# Patient Record
Sex: Female | Born: 1945
Health system: Southern US, Community
[De-identification: ages and names within clinical notes are randomized; demographics above are authoritative.]

## PROBLEM LIST (undated history)

## (undated) DIAGNOSIS — I10 Essential (primary) hypertension: Secondary | ICD-10-CM

## (undated) DIAGNOSIS — K219 Gastro-esophageal reflux disease without esophagitis: Secondary | ICD-10-CM

## (undated) DIAGNOSIS — D369 Benign neoplasm, unspecified site: Secondary | ICD-10-CM

## (undated) DIAGNOSIS — J45909 Unspecified asthma, uncomplicated: Secondary | ICD-10-CM

## (undated) DIAGNOSIS — Z972 Presence of dental prosthetic device (complete) (partial): Secondary | ICD-10-CM

## (undated) HISTORY — PX: COLONOSCOPY: SHX174

## (undated) HISTORY — PX: ABDOMINAL HYSTERECTOMY: SHX81

---

## 2002-03-18 HISTORY — PX: BREAST EXCISIONAL BIOPSY: SUR124

## 2004-06-01 ENCOUNTER — Ambulatory Visit: Payer: Self-pay | Admitting: Unknown Physician Specialty

## 2007-02-16 ENCOUNTER — Ambulatory Visit: Payer: Self-pay | Admitting: Internal Medicine

## 2007-02-19 ENCOUNTER — Ambulatory Visit: Payer: Self-pay | Admitting: Internal Medicine

## 2007-06-19 ENCOUNTER — Ambulatory Visit: Payer: Self-pay | Admitting: Internal Medicine

## 2007-07-07 ENCOUNTER — Ambulatory Visit: Payer: Self-pay | Admitting: Unknown Physician Specialty

## 2007-12-22 ENCOUNTER — Ambulatory Visit: Payer: Self-pay | Admitting: Internal Medicine

## 2008-12-29 ENCOUNTER — Ambulatory Visit: Payer: Self-pay | Admitting: Internal Medicine

## 2009-12-25 ENCOUNTER — Ambulatory Visit: Payer: Self-pay | Admitting: Internal Medicine

## 2011-01-23 ENCOUNTER — Ambulatory Visit: Payer: Self-pay | Admitting: Internal Medicine

## 2012-01-28 ENCOUNTER — Ambulatory Visit: Payer: Self-pay | Admitting: Internal Medicine

## 2012-08-20 ENCOUNTER — Ambulatory Visit: Payer: Self-pay | Admitting: Unknown Physician Specialty

## 2013-01-28 ENCOUNTER — Ambulatory Visit: Payer: Self-pay | Admitting: Internal Medicine

## 2013-05-03 ENCOUNTER — Ambulatory Visit: Payer: Self-pay | Admitting: Internal Medicine

## 2014-07-11 ENCOUNTER — Ambulatory Visit
Admit: 2014-07-11 | Disposition: A | Discharge status: Home / Self Care | Payer: Self-pay | Attending: Internal Medicine | Admitting: Internal Medicine

## 2014-07-11 DIAGNOSIS — Z1231 Encounter for screening mammogram for malignant neoplasm of breast: Secondary | ICD-10-CM | POA: Diagnosis not present

## 2014-08-17 DIAGNOSIS — I10 Essential (primary) hypertension: Secondary | ICD-10-CM | POA: Diagnosis not present

## 2014-08-17 DIAGNOSIS — R7309 Other abnormal glucose: Secondary | ICD-10-CM | POA: Diagnosis not present

## 2014-08-17 DIAGNOSIS — E780 Pure hypercholesterolemia: Secondary | ICD-10-CM | POA: Diagnosis not present

## 2014-08-17 DIAGNOSIS — J4520 Mild intermittent asthma, uncomplicated: Secondary | ICD-10-CM | POA: Diagnosis not present

## 2014-08-24 DIAGNOSIS — E780 Pure hypercholesterolemia: Secondary | ICD-10-CM | POA: Diagnosis not present

## 2014-08-24 DIAGNOSIS — I10 Essential (primary) hypertension: Secondary | ICD-10-CM | POA: Diagnosis not present

## 2014-08-24 DIAGNOSIS — J4520 Mild intermittent asthma, uncomplicated: Secondary | ICD-10-CM | POA: Diagnosis not present

## 2014-12-23 DIAGNOSIS — X32XXXA Exposure to sunlight, initial encounter: Secondary | ICD-10-CM | POA: Diagnosis not present

## 2014-12-23 DIAGNOSIS — D2372 Other benign neoplasm of skin of left lower limb, including hip: Secondary | ICD-10-CM | POA: Diagnosis not present

## 2014-12-23 DIAGNOSIS — L918 Other hypertrophic disorders of the skin: Secondary | ICD-10-CM | POA: Diagnosis not present

## 2014-12-23 DIAGNOSIS — L570 Actinic keratosis: Secondary | ICD-10-CM | POA: Diagnosis not present

## 2014-12-23 DIAGNOSIS — D225 Melanocytic nevi of trunk: Secondary | ICD-10-CM | POA: Diagnosis not present

## 2014-12-23 DIAGNOSIS — D2261 Melanocytic nevi of right upper limb, including shoulder: Secondary | ICD-10-CM | POA: Diagnosis not present

## 2015-02-17 DIAGNOSIS — J4520 Mild intermittent asthma, uncomplicated: Secondary | ICD-10-CM | POA: Diagnosis not present

## 2015-02-17 DIAGNOSIS — I10 Essential (primary) hypertension: Secondary | ICD-10-CM | POA: Diagnosis not present

## 2015-02-17 DIAGNOSIS — E7800 Pure hypercholesterolemia, unspecified: Secondary | ICD-10-CM | POA: Diagnosis not present

## 2015-02-24 DIAGNOSIS — I10 Essential (primary) hypertension: Secondary | ICD-10-CM | POA: Diagnosis not present

## 2015-02-24 DIAGNOSIS — E7800 Pure hypercholesterolemia, unspecified: Secondary | ICD-10-CM | POA: Diagnosis not present

## 2015-02-24 DIAGNOSIS — Z0000 Encounter for general adult medical examination without abnormal findings: Secondary | ICD-10-CM | POA: Diagnosis not present

## 2015-02-24 DIAGNOSIS — J4520 Mild intermittent asthma, uncomplicated: Secondary | ICD-10-CM | POA: Diagnosis not present

## 2015-08-18 DIAGNOSIS — E7800 Pure hypercholesterolemia, unspecified: Secondary | ICD-10-CM | POA: Diagnosis not present

## 2015-08-18 DIAGNOSIS — I10 Essential (primary) hypertension: Secondary | ICD-10-CM | POA: Diagnosis not present

## 2015-08-18 DIAGNOSIS — Z0000 Encounter for general adult medical examination without abnormal findings: Secondary | ICD-10-CM | POA: Diagnosis not present

## 2015-08-18 DIAGNOSIS — J4520 Mild intermittent asthma, uncomplicated: Secondary | ICD-10-CM | POA: Diagnosis not present

## 2015-08-25 DIAGNOSIS — J4520 Mild intermittent asthma, uncomplicated: Secondary | ICD-10-CM | POA: Diagnosis not present

## 2015-08-25 DIAGNOSIS — I10 Essential (primary) hypertension: Secondary | ICD-10-CM | POA: Diagnosis not present

## 2015-08-25 DIAGNOSIS — R635 Abnormal weight gain: Secondary | ICD-10-CM | POA: Diagnosis not present

## 2015-08-25 DIAGNOSIS — E7800 Pure hypercholesterolemia, unspecified: Secondary | ICD-10-CM | POA: Diagnosis not present

## 2015-12-12 DIAGNOSIS — Z23 Encounter for immunization: Secondary | ICD-10-CM | POA: Diagnosis not present

## 2015-12-15 DIAGNOSIS — J4520 Mild intermittent asthma, uncomplicated: Secondary | ICD-10-CM | POA: Diagnosis not present

## 2015-12-15 DIAGNOSIS — R635 Abnormal weight gain: Secondary | ICD-10-CM | POA: Diagnosis not present

## 2015-12-15 DIAGNOSIS — E7800 Pure hypercholesterolemia, unspecified: Secondary | ICD-10-CM | POA: Diagnosis not present

## 2015-12-15 DIAGNOSIS — I10 Essential (primary) hypertension: Secondary | ICD-10-CM | POA: Diagnosis not present

## 2015-12-22 ENCOUNTER — Other Ambulatory Visit: Payer: Self-pay | Admitting: Internal Medicine

## 2015-12-22 DIAGNOSIS — L719 Rosacea, unspecified: Secondary | ICD-10-CM | POA: Diagnosis not present

## 2015-12-22 DIAGNOSIS — J302 Other seasonal allergic rhinitis: Secondary | ICD-10-CM | POA: Diagnosis not present

## 2015-12-22 DIAGNOSIS — Z1231 Encounter for screening mammogram for malignant neoplasm of breast: Secondary | ICD-10-CM

## 2015-12-22 DIAGNOSIS — I10 Essential (primary) hypertension: Secondary | ICD-10-CM | POA: Diagnosis not present

## 2015-12-22 DIAGNOSIS — E7800 Pure hypercholesterolemia, unspecified: Secondary | ICD-10-CM | POA: Diagnosis not present

## 2015-12-22 DIAGNOSIS — Z0000 Encounter for general adult medical examination without abnormal findings: Secondary | ICD-10-CM | POA: Diagnosis not present

## 2015-12-22 DIAGNOSIS — R635 Abnormal weight gain: Secondary | ICD-10-CM | POA: Diagnosis not present

## 2016-01-02 DIAGNOSIS — D225 Melanocytic nevi of trunk: Secondary | ICD-10-CM | POA: Diagnosis not present

## 2016-01-02 DIAGNOSIS — D2272 Melanocytic nevi of left lower limb, including hip: Secondary | ICD-10-CM | POA: Diagnosis not present

## 2016-01-02 DIAGNOSIS — D2261 Melanocytic nevi of right upper limb, including shoulder: Secondary | ICD-10-CM | POA: Diagnosis not present

## 2016-01-02 DIAGNOSIS — L718 Other rosacea: Secondary | ICD-10-CM | POA: Diagnosis not present

## 2016-01-23 ENCOUNTER — Ambulatory Visit
Admission: RE | Admit: 2016-01-23 | Discharge: 2016-01-23 | Disposition: A | Discharge status: Home / Self Care | Payer: Commercial Managed Care - HMO | Source: Ambulatory Visit | Attending: Internal Medicine | Admitting: Internal Medicine

## 2016-01-23 DIAGNOSIS — Z1231 Encounter for screening mammogram for malignant neoplasm of breast: Secondary | ICD-10-CM | POA: Insufficient documentation

## 2016-04-29 DIAGNOSIS — H43811 Vitreous degeneration, right eye: Secondary | ICD-10-CM | POA: Diagnosis not present

## 2016-06-12 DIAGNOSIS — Z1231 Encounter for screening mammogram for malignant neoplasm of breast: Secondary | ICD-10-CM | POA: Diagnosis not present

## 2016-06-12 DIAGNOSIS — J302 Other seasonal allergic rhinitis: Secondary | ICD-10-CM | POA: Diagnosis not present

## 2016-06-12 DIAGNOSIS — I10 Essential (primary) hypertension: Secondary | ICD-10-CM | POA: Diagnosis not present

## 2016-06-12 DIAGNOSIS — E7800 Pure hypercholesterolemia, unspecified: Secondary | ICD-10-CM | POA: Diagnosis not present

## 2016-06-12 DIAGNOSIS — R635 Abnormal weight gain: Secondary | ICD-10-CM | POA: Diagnosis not present

## 2016-06-19 DIAGNOSIS — J4520 Mild intermittent asthma, uncomplicated: Secondary | ICD-10-CM | POA: Diagnosis not present

## 2016-06-19 DIAGNOSIS — E7800 Pure hypercholesterolemia, unspecified: Secondary | ICD-10-CM | POA: Diagnosis not present

## 2016-06-19 DIAGNOSIS — I10 Essential (primary) hypertension: Secondary | ICD-10-CM | POA: Diagnosis not present

## 2016-06-19 DIAGNOSIS — R635 Abnormal weight gain: Secondary | ICD-10-CM | POA: Diagnosis not present

## 2016-06-19 DIAGNOSIS — Z0000 Encounter for general adult medical examination without abnormal findings: Secondary | ICD-10-CM | POA: Diagnosis not present

## 2016-06-28 DIAGNOSIS — H43811 Vitreous degeneration, right eye: Secondary | ICD-10-CM | POA: Diagnosis not present

## 2016-12-10 DIAGNOSIS — Z23 Encounter for immunization: Secondary | ICD-10-CM | POA: Diagnosis not present

## 2016-12-13 DIAGNOSIS — Z0000 Encounter for general adult medical examination without abnormal findings: Secondary | ICD-10-CM | POA: Diagnosis not present

## 2016-12-13 DIAGNOSIS — J4520 Mild intermittent asthma, uncomplicated: Secondary | ICD-10-CM | POA: Diagnosis not present

## 2016-12-13 DIAGNOSIS — E7800 Pure hypercholesterolemia, unspecified: Secondary | ICD-10-CM | POA: Diagnosis not present

## 2016-12-13 DIAGNOSIS — I10 Essential (primary) hypertension: Secondary | ICD-10-CM | POA: Diagnosis not present

## 2016-12-20 ENCOUNTER — Other Ambulatory Visit: Payer: Self-pay | Admitting: Internal Medicine

## 2016-12-20 DIAGNOSIS — E7800 Pure hypercholesterolemia, unspecified: Secondary | ICD-10-CM | POA: Diagnosis not present

## 2016-12-20 DIAGNOSIS — M79671 Pain in right foot: Secondary | ICD-10-CM | POA: Diagnosis not present

## 2016-12-20 DIAGNOSIS — J4520 Mild intermittent asthma, uncomplicated: Secondary | ICD-10-CM | POA: Diagnosis not present

## 2016-12-20 DIAGNOSIS — R7989 Other specified abnormal findings of blood chemistry: Secondary | ICD-10-CM | POA: Diagnosis not present

## 2016-12-20 DIAGNOSIS — M19071 Primary osteoarthritis, right ankle and foot: Secondary | ICD-10-CM | POA: Diagnosis not present

## 2016-12-20 DIAGNOSIS — Z1231 Encounter for screening mammogram for malignant neoplasm of breast: Secondary | ICD-10-CM | POA: Diagnosis not present

## 2016-12-20 DIAGNOSIS — I10 Essential (primary) hypertension: Secondary | ICD-10-CM | POA: Diagnosis not present

## 2017-01-24 ENCOUNTER — Ambulatory Visit
Admission: RE | Admit: 2017-01-24 | Discharge: 2017-01-24 | Disposition: A | Discharge status: Home / Self Care | Payer: Commercial Managed Care - HMO | Source: Ambulatory Visit | Attending: Internal Medicine | Admitting: Internal Medicine

## 2017-01-24 DIAGNOSIS — Z1231 Encounter for screening mammogram for malignant neoplasm of breast: Secondary | ICD-10-CM | POA: Diagnosis not present

## 2017-03-14 DIAGNOSIS — R399 Unspecified symptoms and signs involving the genitourinary system: Secondary | ICD-10-CM | POA: Diagnosis not present

## 2017-03-14 DIAGNOSIS — R8290 Unspecified abnormal findings in urine: Secondary | ICD-10-CM | POA: Diagnosis not present

## 2017-06-13 DIAGNOSIS — Z1231 Encounter for screening mammogram for malignant neoplasm of breast: Secondary | ICD-10-CM | POA: Diagnosis not present

## 2017-06-13 DIAGNOSIS — E7800 Pure hypercholesterolemia, unspecified: Secondary | ICD-10-CM | POA: Diagnosis not present

## 2017-06-13 DIAGNOSIS — J4520 Mild intermittent asthma, uncomplicated: Secondary | ICD-10-CM | POA: Diagnosis not present

## 2017-06-13 DIAGNOSIS — R7989 Other specified abnormal findings of blood chemistry: Secondary | ICD-10-CM | POA: Diagnosis not present

## 2017-06-13 DIAGNOSIS — I10 Essential (primary) hypertension: Secondary | ICD-10-CM | POA: Diagnosis not present

## 2017-06-20 DIAGNOSIS — E7800 Pure hypercholesterolemia, unspecified: Secondary | ICD-10-CM | POA: Diagnosis not present

## 2017-06-20 DIAGNOSIS — I10 Essential (primary) hypertension: Secondary | ICD-10-CM | POA: Diagnosis not present

## 2017-06-20 DIAGNOSIS — J4520 Mild intermittent asthma, uncomplicated: Secondary | ICD-10-CM | POA: Diagnosis not present

## 2017-06-20 DIAGNOSIS — Z0000 Encounter for general adult medical examination without abnormal findings: Secondary | ICD-10-CM | POA: Diagnosis not present

## 2017-09-23 DIAGNOSIS — B373 Candidiasis of vulva and vagina: Secondary | ICD-10-CM | POA: Diagnosis not present

## 2017-09-23 DIAGNOSIS — Z1331 Encounter for screening for depression: Secondary | ICD-10-CM | POA: Diagnosis not present

## 2017-09-23 DIAGNOSIS — Z1211 Encounter for screening for malignant neoplasm of colon: Secondary | ICD-10-CM | POA: Diagnosis not present

## 2017-09-23 DIAGNOSIS — Z01419 Encounter for gynecological examination (general) (routine) without abnormal findings: Secondary | ICD-10-CM | POA: Diagnosis not present

## 2017-11-06 DIAGNOSIS — D225 Melanocytic nevi of trunk: Secondary | ICD-10-CM | POA: Diagnosis not present

## 2017-11-06 DIAGNOSIS — D2271 Melanocytic nevi of right lower limb, including hip: Secondary | ICD-10-CM | POA: Diagnosis not present

## 2017-11-06 DIAGNOSIS — D2261 Melanocytic nevi of right upper limb, including shoulder: Secondary | ICD-10-CM | POA: Diagnosis not present

## 2017-11-06 DIAGNOSIS — L218 Other seborrheic dermatitis: Secondary | ICD-10-CM | POA: Diagnosis not present

## 2017-11-06 DIAGNOSIS — D2262 Melanocytic nevi of left upper limb, including shoulder: Secondary | ICD-10-CM | POA: Diagnosis not present

## 2017-11-06 DIAGNOSIS — L821 Other seborrheic keratosis: Secondary | ICD-10-CM | POA: Diagnosis not present

## 2017-11-06 DIAGNOSIS — D2272 Melanocytic nevi of left lower limb, including hip: Secondary | ICD-10-CM | POA: Diagnosis not present

## 2017-12-01 DIAGNOSIS — H2513 Age-related nuclear cataract, bilateral: Secondary | ICD-10-CM | POA: Diagnosis not present

## 2017-12-16 DIAGNOSIS — I10 Essential (primary) hypertension: Secondary | ICD-10-CM | POA: Diagnosis not present

## 2017-12-16 DIAGNOSIS — J4520 Mild intermittent asthma, uncomplicated: Secondary | ICD-10-CM | POA: Diagnosis not present

## 2017-12-16 DIAGNOSIS — E7800 Pure hypercholesterolemia, unspecified: Secondary | ICD-10-CM | POA: Diagnosis not present

## 2017-12-23 ENCOUNTER — Other Ambulatory Visit: Payer: Self-pay | Admitting: Internal Medicine

## 2017-12-23 DIAGNOSIS — J4520 Mild intermittent asthma, uncomplicated: Secondary | ICD-10-CM | POA: Diagnosis not present

## 2017-12-23 DIAGNOSIS — Z23 Encounter for immunization: Secondary | ICD-10-CM | POA: Diagnosis not present

## 2017-12-23 DIAGNOSIS — R635 Abnormal weight gain: Secondary | ICD-10-CM | POA: Diagnosis not present

## 2017-12-23 DIAGNOSIS — I10 Essential (primary) hypertension: Secondary | ICD-10-CM | POA: Diagnosis not present

## 2017-12-23 DIAGNOSIS — Z1239 Encounter for other screening for malignant neoplasm of breast: Secondary | ICD-10-CM | POA: Diagnosis not present

## 2017-12-23 DIAGNOSIS — Z0000 Encounter for general adult medical examination without abnormal findings: Secondary | ICD-10-CM | POA: Diagnosis not present

## 2017-12-23 DIAGNOSIS — Z1231 Encounter for screening mammogram for malignant neoplasm of breast: Secondary | ICD-10-CM

## 2017-12-23 DIAGNOSIS — E7800 Pure hypercholesterolemia, unspecified: Secondary | ICD-10-CM | POA: Diagnosis not present

## 2017-12-24 DIAGNOSIS — Z86010 Personal history of colonic polyps: Secondary | ICD-10-CM | POA: Diagnosis not present

## 2018-01-13 ENCOUNTER — Encounter: Payer: Self-pay | Admitting: *Deleted

## 2018-01-14 ENCOUNTER — Ambulatory Visit: Payer: Medicare HMO | Admitting: Anesthesiology

## 2018-01-14 ENCOUNTER — Encounter
Admission: RE | Disposition: A | Discharge status: Home / Self Care | Payer: Self-pay | Source: Ambulatory Visit | Attending: Unknown Physician Specialty

## 2018-01-14 ENCOUNTER — Encounter: Payer: Self-pay | Admitting: Anesthesiology

## 2018-01-14 ENCOUNTER — Ambulatory Visit
Admission: RE | Admit: 2018-01-14 | Discharge: 2018-01-14 | Disposition: A | Discharge status: Home / Self Care | Payer: Medicare HMO | Source: Ambulatory Visit | Attending: Unknown Physician Specialty | Admitting: Unknown Physician Specialty

## 2018-01-14 DIAGNOSIS — Z86010 Personal history of colonic polyps: Secondary | ICD-10-CM | POA: Diagnosis not present

## 2018-01-14 DIAGNOSIS — Z6834 Body mass index (BMI) 34.0-34.9, adult: Secondary | ICD-10-CM | POA: Diagnosis not present

## 2018-01-14 DIAGNOSIS — Z1211 Encounter for screening for malignant neoplasm of colon: Secondary | ICD-10-CM | POA: Insufficient documentation

## 2018-01-14 DIAGNOSIS — J45909 Unspecified asthma, uncomplicated: Secondary | ICD-10-CM | POA: Insufficient documentation

## 2018-01-14 DIAGNOSIS — K635 Polyp of colon: Secondary | ICD-10-CM | POA: Diagnosis not present

## 2018-01-14 DIAGNOSIS — K648 Other hemorrhoids: Secondary | ICD-10-CM | POA: Diagnosis not present

## 2018-01-14 DIAGNOSIS — K219 Gastro-esophageal reflux disease without esophagitis: Secondary | ICD-10-CM | POA: Diagnosis not present

## 2018-01-14 DIAGNOSIS — K640 First degree hemorrhoids: Secondary | ICD-10-CM | POA: Diagnosis not present

## 2018-01-14 DIAGNOSIS — Z7982 Long term (current) use of aspirin: Secondary | ICD-10-CM | POA: Diagnosis not present

## 2018-01-14 DIAGNOSIS — Z79899 Other long term (current) drug therapy: Secondary | ICD-10-CM | POA: Insufficient documentation

## 2018-01-14 DIAGNOSIS — K5790 Diverticulosis of intestine, part unspecified, without perforation or abscess without bleeding: Secondary | ICD-10-CM | POA: Diagnosis not present

## 2018-01-14 DIAGNOSIS — E669 Obesity, unspecified: Secondary | ICD-10-CM | POA: Insufficient documentation

## 2018-01-14 DIAGNOSIS — I10 Essential (primary) hypertension: Secondary | ICD-10-CM | POA: Diagnosis not present

## 2018-01-14 DIAGNOSIS — K5730 Diverticulosis of large intestine without perforation or abscess without bleeding: Secondary | ICD-10-CM | POA: Insufficient documentation

## 2018-01-14 DIAGNOSIS — D122 Benign neoplasm of ascending colon: Secondary | ICD-10-CM | POA: Diagnosis not present

## 2018-01-14 HISTORY — DX: Unspecified asthma, uncomplicated: J45.909

## 2018-01-14 HISTORY — PX: COLONOSCOPY WITH PROPOFOL: SHX5780

## 2018-01-14 HISTORY — DX: Essential (primary) hypertension: I10

## 2018-01-14 HISTORY — DX: Benign neoplasm, unspecified site: D36.9

## 2018-01-14 HISTORY — DX: Gastro-esophageal reflux disease without esophagitis: K21.9

## 2018-01-14 SURGERY — COLONOSCOPY WITH PROPOFOL
Anesthesia: General

## 2018-01-14 MED ORDER — PROPOFOL 500 MG/50ML IV EMUL
INTRAVENOUS | Status: DC | PRN
  Administered 2018-01-14: 80 ug/kg/min via INTRAVENOUS

## 2018-01-14 MED ORDER — SODIUM CHLORIDE 0.9 % IV SOLN: INTRAVENOUS | Status: DC

## 2018-01-14 MED ORDER — PROPOFOL 10 MG/ML IV BOLUS
INTRAVENOUS | Status: DC | PRN
  Administered 2018-01-14: 50 mg via INTRAVENOUS
  Administered 2018-01-14: 20 mg via INTRAVENOUS
  Administered 2018-01-14: 30 mg via INTRAVENOUS

## 2018-01-14 MED ORDER — SODIUM CHLORIDE 0.9 % IV SOLN
INTRAVENOUS | Status: DC
  Administered 2018-01-14: 11:00:00 via INTRAVENOUS

## 2018-01-14 MED ORDER — PROPOFOL 500 MG/50ML IV EMUL
INTRAVENOUS | Status: AC
  Filled 2018-01-14: qty 50

## 2018-01-14 NOTE — Op Note (Signed)
[facility] Gastroenterology Patient Name: Breanna Gilbert Procedure Date: 01/14/2018 12:09 PM MRN: [id number] Account #: [id number] Date of Birth: 1945/05/20 Admit Type: Outpatient Age: 72 Room: [facility] ENDO ROOM 2 Gender: Female Note Status: Finalized Procedure:            Colonoscopy Indications:          High risk colon cancer surveillance: Personal history                        of colonic polyps Providers:            Manya Silvas, MD Referring MD:         Tracie Harrier, MD (Referring MD) Medicines:            Propofol per Anesthesia Complications:        No immediate complications. Procedure:            Pre-Anesthesia Assessment:                       - After reviewing the risks and benefits, the patient                        was deemed in satisfactory condition to undergo the                        procedure.                       After obtaining informed consent, the colonoscope was                        passed under direct vision. Throughout the procedure,                        the patient's blood pressure, pulse, and oxygen                        saturations were monitored continuously. The                        Colonoscope was introduced through the anus and                        advanced to the the cecum, identified by appendiceal                        orifice and ileocecal valve. Picture of cecum not done                        by accident.The colonoscopy was performed without                        difficulty. The patient tolerated the procedure well.                        The quality of the bowel preparation was excellent. Findings:      A diminutive polyp was found in the ascending colon. The polyp was       sessile. The polyp was removed with a jumbo cold forceps. Resection and       retrieval were complete.  Multiple small and large-mouthed diverticula were found in the sigmoid       colon and descending colon.      Internal  hemorrhoids were found during endoscopy. The hemorrhoids were       small and Grade I (internal hemorrhoids that do not prolapse).      The exam was otherwise without abnormality. Impression:           - One diminutive polyp in the ascending colon, removed                        with a jumbo cold forceps. Resected and retrieved.                       - Diverticulosis in the sigmoid colon and in the                        descending colon.                       - Internal hemorrhoids.                       - The examination was otherwise normal. Recommendation:       - Await pathology results. Manya Silvas, MD 01/14/2018 12:37:44 PM This report has been signed electronically. Number of Addenda: 0 Note Initiated On: 01/14/2018 12:09 PM Scope Withdrawal Time: 0 hours 11 minutes 49 seconds  Total Procedure Duration: 0 hours 19 minutes 33 seconds       [facility]

## 2018-01-14 NOTE — Anesthesia Postprocedure Evaluation (Signed)
Anesthesia Post Note  Patient: Breanna Gilbert  Procedure(s) Performed: COLONOSCOPY WITH PROPOFOL (N/A )  Patient location during evaluation: Endoscopy Anesthesia Type: General Level of consciousness: awake and alert Pain management: pain level controlled Vital Signs Assessment: post-procedure vital signs reviewed and stable Respiratory status: spontaneous breathing, nonlabored ventilation, respiratory function stable and patient connected to nasal cannula oxygen Cardiovascular status: blood pressure returned to baseline and stable Postop Assessment: no apparent nausea or vomiting Anesthetic complications: no     Last Vitals:  Vitals:   01/14/18 1246 01/14/18 1256  BP: (!) 130/115 136/88  Pulse: 71 78  Resp: (!) 26 (!) 22  Temp:    SpO2: 100% 100%    Last Pain:  Vitals:   01/14/18 1256  TempSrc:   PainSc: 0-No pain                 [name] S

## 2018-01-14 NOTE — Anesthesia Post-op Follow-up Note (Signed)
Anesthesia QCDR form completed.        

## 2018-01-14 NOTE — Transfer of Care (Signed)
Immediate Anesthesia Transfer of Care Note  Patient: Breanna Gilbert  Procedure(s) Performed: COLONOSCOPY WITH PROPOFOL (N/A )  Patient Location: PACU  Anesthesia Type:General  Level of Consciousness: awake  Airway & Oxygen Therapy: Patient Spontanous Breathing  Post-op Assessment: Report given to RN  Post vital signs: stable  Last Vitals:  Vitals Value Taken Time  BP    Temp    Pulse 75 01/14/2018 12:36 PM  Resp    SpO2 99 % 01/14/2018 12:36 PM  Vitals shown include unvalidated device data.  Last Pain:  Vitals:   01/14/18 1033  TempSrc: Tympanic         Complications: No apparent anesthesia complications

## 2018-01-14 NOTE — H&P (Signed)
Primary Care Physician:  Tracie Harrier, MD Primary Gastroenterologist:  Dr. Vira Agar  Pre-Procedure History & Physical: HPI:  Breanna Gilbert is a 72 y.o. female is here for an colonoscopy.   Past Medical History:  Diagnosis Date  . Adenomatous polyps   . Asthma   . GERD (gastroesophageal reflux disease)   . Hypertension     Past Surgical History:  Procedure Laterality Date  . ABDOMINAL HYSTERECTOMY    . BREAST EXCISIONAL BIOPSY Right 2004   NEG  . COLONOSCOPY      Prior to Admission medications   Medication Sig Start Date End Date Taking? Authorizing Provider  albuterol (PROVENTIL HFA;VENTOLIN HFA) 108 (90 Base) MCG/ACT inhaler Inhale into the lungs every 6 (six) hours as needed for wheezing or shortness of breath.   Yes [provider]  Ascorbic Acid (VITAMIN C) 100 MG tablet Take 100 mg by mouth daily.   Yes [provider]  aspirin 325 MG tablet Take 325 mg by mouth daily.   Yes [provider]  cetirizine (ZYRTEC) 10 MG tablet Take 10 mg by mouth daily.   Yes [provider]  Folic Acid-Vit B7-JIR C78 (FOLBEE) 2.5-25-1 MG TABS tablet Take 1 tablet by mouth daily.   Yes [provider]  losartan (COZAAR) 50 MG tablet Take 50 mg by mouth daily.   Yes [provider]  phentermine (ADIPEX-P) 37.5 MG tablet Take 37.5 mg by mouth daily before breakfast.   Yes [provider]  simvastatin (ZOCOR) 40 MG tablet Take 40 mg by mouth daily.   Yes [provider]  budesonide-formoterol (SYMBICORT) 160-4.5 MCG/ACT inhaler Inhale 2 puffs into the lungs 2 (two) times daily.    [provider]  vitamin E 100 UNIT capsule Take 400 Units by mouth daily.    [provider]    Allergies as of 01/06/2018  . (Not on File)    Family History  Problem Relation Age of Onset  . Breast cancer Maternal Aunt     Social History   Socioeconomic History  . Marital status: Divorced    Spouse name: Not on  file  . Number of children: Not on file  . Years of education: Not on file  . Highest education level: Not on file  Occupational History  . Not on file  Social Needs  . Financial resource strain: Not on file  . Food insecurity:    Worry: Not on file    Inability: Not on file  . Transportation needs:    Medical: Not on file    Non-medical: Not on file  Tobacco Use  . Smoking status: Never Smoker  . Smokeless tobacco: Never Used  Substance and Sexual Activity  . Alcohol use: Never    Frequency: Never  . Drug use: Never  . Sexual activity: Not on file  Lifestyle  . Physical activity:    Days per week: Not on file    Minutes per session: Not on file  . Stress: Not on file  Relationships  . Social connections:    Talks on phone: Not on file    Gets together: Not on file    Attends religious service: Not on file    Active member of club or organization: Not on file    Attends meetings of clubs or organizations: Not on file    Relationship status: Not on file  . Intimate partner violence:    Fear of current or ex partner: Not on file  Emotionally abused: Not on file    Physically abused: Not on file    Forced sexual activity: Not on file  Other Topics Concern  . Not on file  Social History Narrative  . Not on file    Review of Systems: See HPI, otherwise negative ROS  Physical Exam: BP (!) 147/68   Pulse 89   Temp 98.7 F (37.1 C) (Tympanic)   Resp 16   Ht 5\' 2"  (1.575 m)   Wt 86.2 kg   SpO2 98%   BMI 34.75 kg/m  General:   Alert,  pleasant and cooperative in NAD Head:  Normocephalic and atraumatic. Neck:  Supple; no masses or thyromegaly. Lungs:  Clear throughout to auscultation.    Heart:  Regular rate and rhythm. Abdomen:  Soft, nontender and nondistended. Normal bowel sounds, without guarding, and without rebound.   Neurologic:  Alert and  oriented x4;  grossly normal neurologically.  Impression/Plan: Breanna Gilbert is here for an colonoscopy to be  performed for [facility] colon polyps.  Risks, benefits, limitations, and alternatives regarding  colonoscopy have been reviewed with the patient.  Questions have been answered.  All parties agreeable.   Gaylyn Cheers, MD  01/14/2018, 12:05 PM

## 2018-01-14 NOTE — Anesthesia Preprocedure Evaluation (Addendum)
Anesthesia Evaluation  Patient identified by MRN, date of birth, ID band Patient awake    Reviewed: Allergy & Precautions, NPO status , Patient's Chart, lab work & pertinent test results, reviewed documented beta blocker date and time   Airway Mallampati: III  TM Distance: >3 FB     Dental  (+) Chipped, Upper Dentures   Pulmonary asthma ,           Cardiovascular hypertension, Pt. on medications      Neuro/Psych    GI/Hepatic   Endo/Other    Renal/GU      Musculoskeletal   Abdominal   Peds  Hematology   Anesthesia Other Findings Obese.  Reproductive/Obstetrics                            Anesthesia Physical Anesthesia Plan  ASA: III  Anesthesia Plan: General   Post-op Pain Management:    Induction: Intravenous  PONV Risk Score and Plan:   Airway Management Planned:   Additional Equipment:   Intra-op Plan:   Post-operative Plan:   Informed Consent: I have reviewed the patients History and Physical, chart, labs and discussed the procedure including the risks, benefits and alternatives for the proposed anesthesia with the patient or authorized representative who has indicated his/her understanding and acceptance.     Plan Discussed with: CRNA  Anesthesia Plan Comments:         Anesthesia Quick Evaluation

## 2018-01-15 ENCOUNTER — Encounter: Payer: Self-pay | Admitting: Unknown Physician Specialty

## 2018-01-16 DIAGNOSIS — I10 Essential (primary) hypertension: Secondary | ICD-10-CM | POA: Diagnosis not present

## 2018-01-16 DIAGNOSIS — E7800 Pure hypercholesterolemia, unspecified: Secondary | ICD-10-CM | POA: Diagnosis not present

## 2018-01-16 DIAGNOSIS — R590 Localized enlarged lymph nodes: Secondary | ICD-10-CM | POA: Diagnosis not present

## 2018-01-16 DIAGNOSIS — J4520 Mild intermittent asthma, uncomplicated: Secondary | ICD-10-CM | POA: Diagnosis not present

## 2018-01-16 DIAGNOSIS — Z23 Encounter for immunization: Secondary | ICD-10-CM | POA: Diagnosis not present

## 2018-01-17 LAB — SURGICAL PATHOLOGY

## 2018-02-11 ENCOUNTER — Ambulatory Visit
Admission: RE | Admit: 2018-02-11 | Discharge: 2018-02-11 | Disposition: A | Discharge status: Home / Self Care | Payer: Medicare HMO | Source: Ambulatory Visit | Attending: Internal Medicine | Admitting: Internal Medicine

## 2018-02-11 DIAGNOSIS — Z1231 Encounter for screening mammogram for malignant neoplasm of breast: Secondary | ICD-10-CM | POA: Diagnosis not present

## 2018-10-30 ENCOUNTER — Other Ambulatory Visit: Payer: Self-pay | Admitting: Internal Medicine

## 2018-10-30 DIAGNOSIS — R590 Localized enlarged lymph nodes: Secondary | ICD-10-CM

## 2018-10-30 DIAGNOSIS — I10 Essential (primary) hypertension: Secondary | ICD-10-CM

## 2018-11-05 ENCOUNTER — Ambulatory Visit: Payer: Medicare HMO

## 2018-11-11 ENCOUNTER — Other Ambulatory Visit: Payer: Self-pay | Admitting: Internal Medicine

## 2018-11-11 DIAGNOSIS — I10 Essential (primary) hypertension: Secondary | ICD-10-CM

## 2018-11-11 DIAGNOSIS — R590 Localized enlarged lymph nodes: Secondary | ICD-10-CM

## 2018-11-16 ENCOUNTER — Encounter (HOSPITAL_COMMUNITY): Payer: Self-pay

## 2018-11-16 ENCOUNTER — Ambulatory Visit (HOSPITAL_COMMUNITY): Payer: Medicare HMO

## 2018-11-17 ENCOUNTER — Ambulatory Visit: Payer: Medicare HMO

## 2018-11-27 ENCOUNTER — Other Ambulatory Visit: Payer: Self-pay | Admitting: Internal Medicine

## 2018-11-27 DIAGNOSIS — R590 Localized enlarged lymph nodes: Secondary | ICD-10-CM

## 2018-12-03 ENCOUNTER — Ambulatory Visit
Admission: RE | Admit: 2018-12-03 | Discharge: 2018-12-03 | Disposition: A | Discharge status: Home / Self Care | Payer: Medicare HMO | Source: Ambulatory Visit | Attending: Internal Medicine | Admitting: Internal Medicine

## 2018-12-03 ENCOUNTER — Other Ambulatory Visit: Payer: Self-pay

## 2018-12-03 DIAGNOSIS — R590 Localized enlarged lymph nodes: Secondary | ICD-10-CM | POA: Insufficient documentation

## 2018-12-07 ENCOUNTER — Other Ambulatory Visit: Payer: Self-pay | Admitting: Internal Medicine

## 2018-12-07 DIAGNOSIS — R591 Generalized enlarged lymph nodes: Secondary | ICD-10-CM

## 2018-12-09 ENCOUNTER — Ambulatory Visit: Admission: RE | Admit: 2018-12-09 | Payer: Medicare HMO | Source: Ambulatory Visit

## 2018-12-18 ENCOUNTER — Other Ambulatory Visit: Payer: Self-pay | Admitting: Internal Medicine

## 2018-12-18 DIAGNOSIS — R053 Chronic cough: Secondary | ICD-10-CM

## 2018-12-18 DIAGNOSIS — R590 Localized enlarged lymph nodes: Secondary | ICD-10-CM

## 2018-12-18 DIAGNOSIS — R05 Cough: Secondary | ICD-10-CM

## 2018-12-18 DIAGNOSIS — R591 Generalized enlarged lymph nodes: Secondary | ICD-10-CM

## 2018-12-31 ENCOUNTER — Other Ambulatory Visit: Payer: Self-pay

## 2018-12-31 ENCOUNTER — Ambulatory Visit
Admission: RE | Admit: 2018-12-31 | Discharge: 2018-12-31 | Disposition: A | Discharge status: Home / Self Care | Payer: Medicare HMO | Source: Ambulatory Visit | Attending: Internal Medicine | Admitting: Internal Medicine

## 2018-12-31 DIAGNOSIS — R05 Cough: Secondary | ICD-10-CM | POA: Diagnosis present

## 2018-12-31 DIAGNOSIS — R591 Generalized enlarged lymph nodes: Secondary | ICD-10-CM | POA: Diagnosis not present

## 2018-12-31 DIAGNOSIS — R053 Chronic cough: Secondary | ICD-10-CM

## 2018-12-31 DIAGNOSIS — R590 Localized enlarged lymph nodes: Secondary | ICD-10-CM | POA: Diagnosis present

## 2018-12-31 LAB — POCT I-STAT CREATININE: Creatinine, Ser: 0.7 mg/dL (ref 0.44–1.00)

## 2018-12-31 MED ORDER — IOHEXOL 300 MG/ML  SOLN
75.0000 mL | Freq: Once | INTRAMUSCULAR | Status: AC | PRN
  Administered 2018-12-31: 10:00:00 75 mL via INTRAVENOUS

## 2019-01-06 ENCOUNTER — Other Ambulatory Visit: Payer: Self-pay | Admitting: Internal Medicine

## 2019-01-06 DIAGNOSIS — K118 Other diseases of salivary glands: Secondary | ICD-10-CM

## 2019-01-06 DIAGNOSIS — R918 Other nonspecific abnormal finding of lung field: Secondary | ICD-10-CM

## 2019-02-03 ENCOUNTER — Other Ambulatory Visit: Payer: Self-pay | Admitting: Internal Medicine

## 2019-02-03 DIAGNOSIS — Z1231 Encounter for screening mammogram for malignant neoplasm of breast: Secondary | ICD-10-CM

## 2019-02-16 ENCOUNTER — Ambulatory Visit
Admission: RE | Admit: 2019-02-16 | Discharge: 2019-02-16 | Disposition: A | Discharge status: Home / Self Care | Payer: Medicare HMO | Source: Ambulatory Visit | Attending: Internal Medicine | Admitting: Internal Medicine

## 2019-02-16 DIAGNOSIS — Z1231 Encounter for screening mammogram for malignant neoplasm of breast: Secondary | ICD-10-CM | POA: Diagnosis not present

## 2019-04-07 ENCOUNTER — Ambulatory Visit: Payer: Medicare HMO

## 2019-06-22 ENCOUNTER — Ambulatory Visit
Admission: RE | Admit: 2019-06-22 | Discharge: 2019-06-22 | Disposition: A | Discharge status: Home / Self Care | Payer: Medicare HMO | Source: Ambulatory Visit | Attending: Internal Medicine | Admitting: Internal Medicine

## 2019-06-22 ENCOUNTER — Other Ambulatory Visit: Payer: Self-pay

## 2019-06-22 DIAGNOSIS — K118 Other diseases of salivary glands: Secondary | ICD-10-CM | POA: Diagnosis not present

## 2019-06-22 DIAGNOSIS — R918 Other nonspecific abnormal finding of lung field: Secondary | ICD-10-CM | POA: Diagnosis present

## 2019-09-03 ENCOUNTER — Other Ambulatory Visit: Payer: Self-pay | Admitting: Internal Medicine

## 2019-09-03 DIAGNOSIS — R053 Chronic cough: Secondary | ICD-10-CM

## 2019-09-03 DIAGNOSIS — J4540 Moderate persistent asthma, uncomplicated: Secondary | ICD-10-CM

## 2019-12-30 IMAGING — MG DIGITAL SCREENING BILAT W/ TOMO W/ CAD
6 of 10 series · 6 of 30 positions shown · non-contrast
Comparison: Previous exam(s).

CLINICAL DATA: Screening.

EXAM:
DIGITAL SCREENING BILATERAL MAMMOGRAM WITH TOMO AND CAD

[R MLO synth-2D]
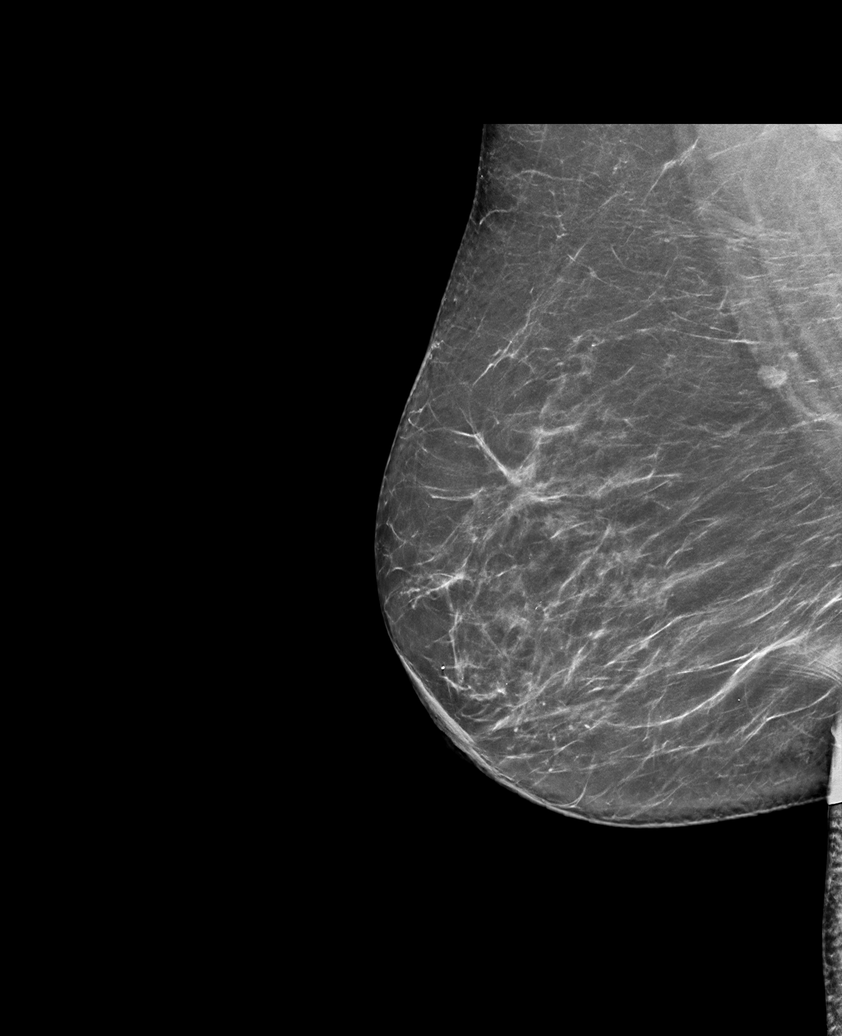

[L MLO synth-2D (1 of 2)]
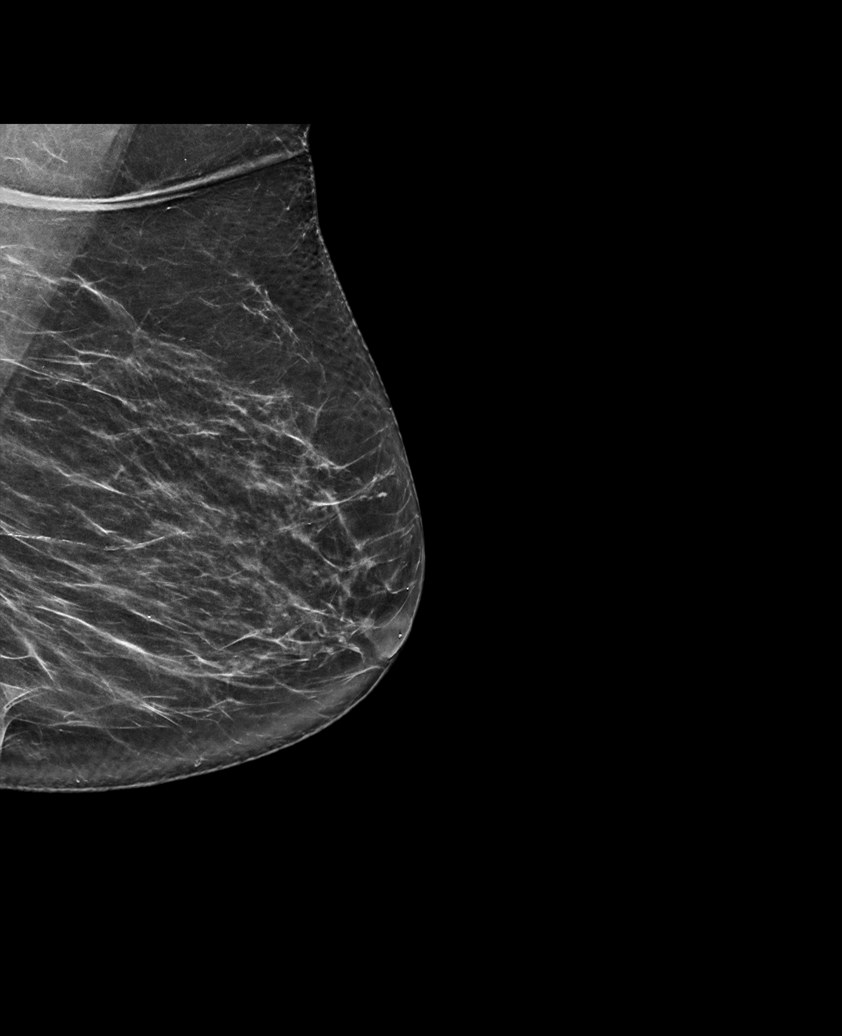

[L CC synth-2D]
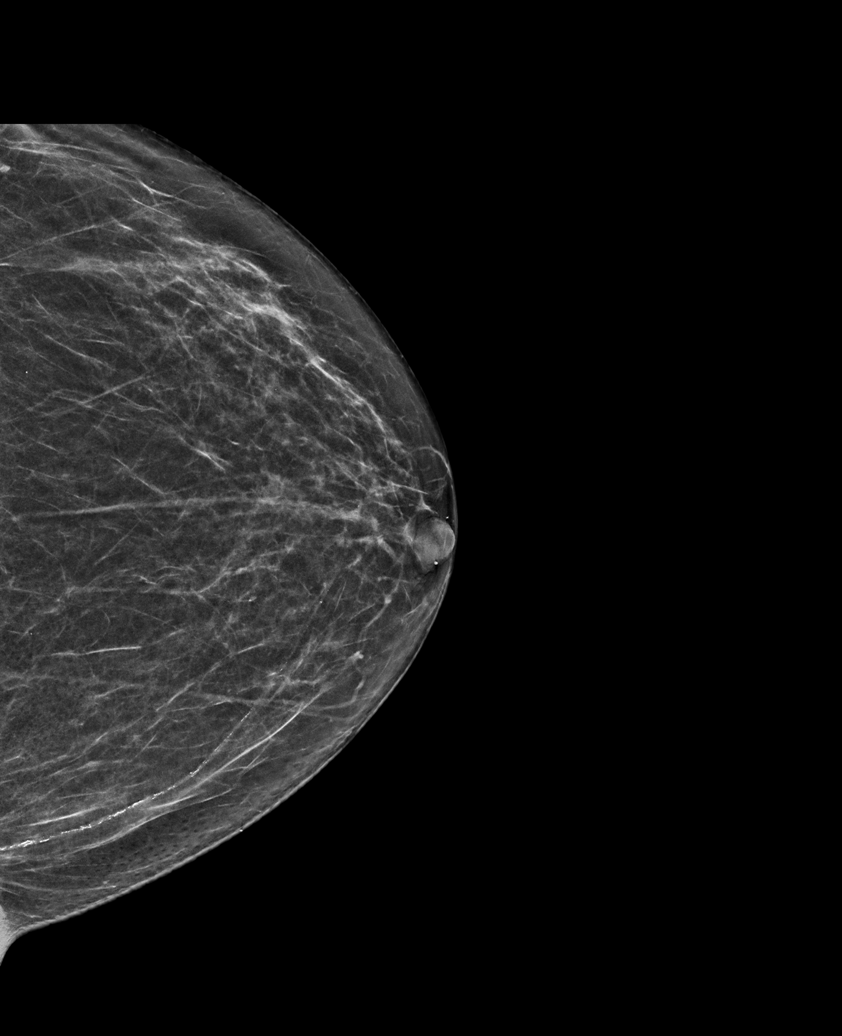

[L MLO synth-2D (2 of 2)]
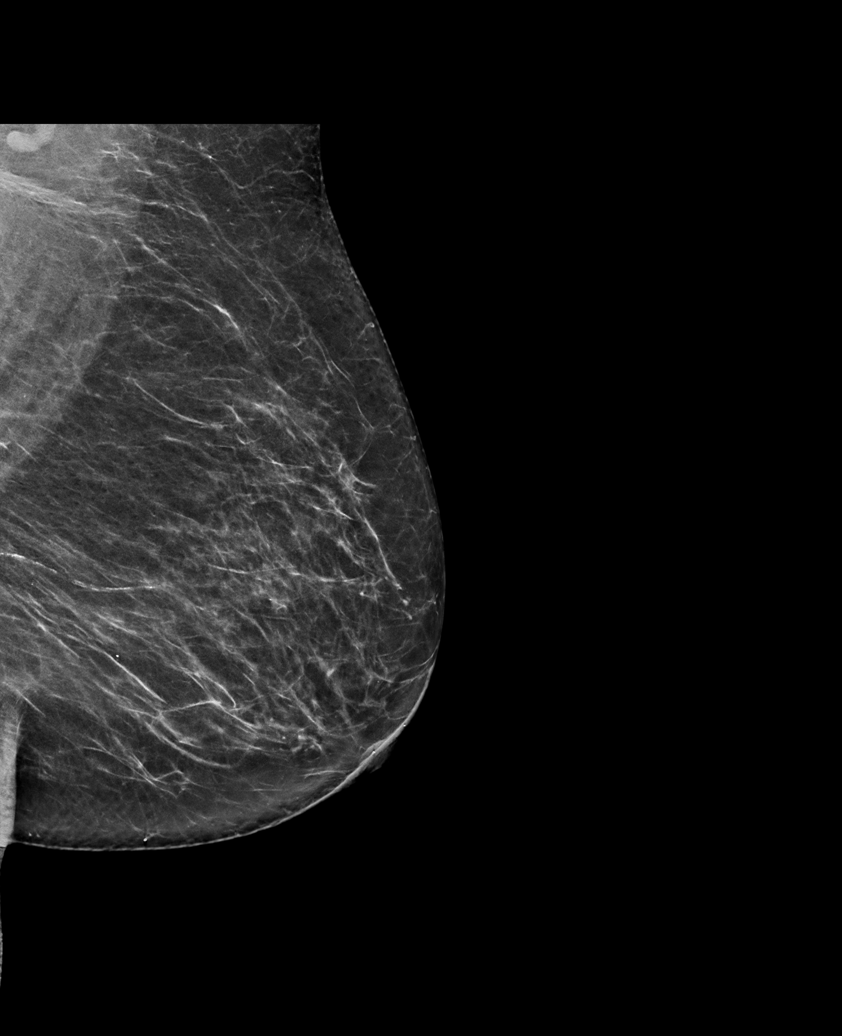

[R CC synth-2D]
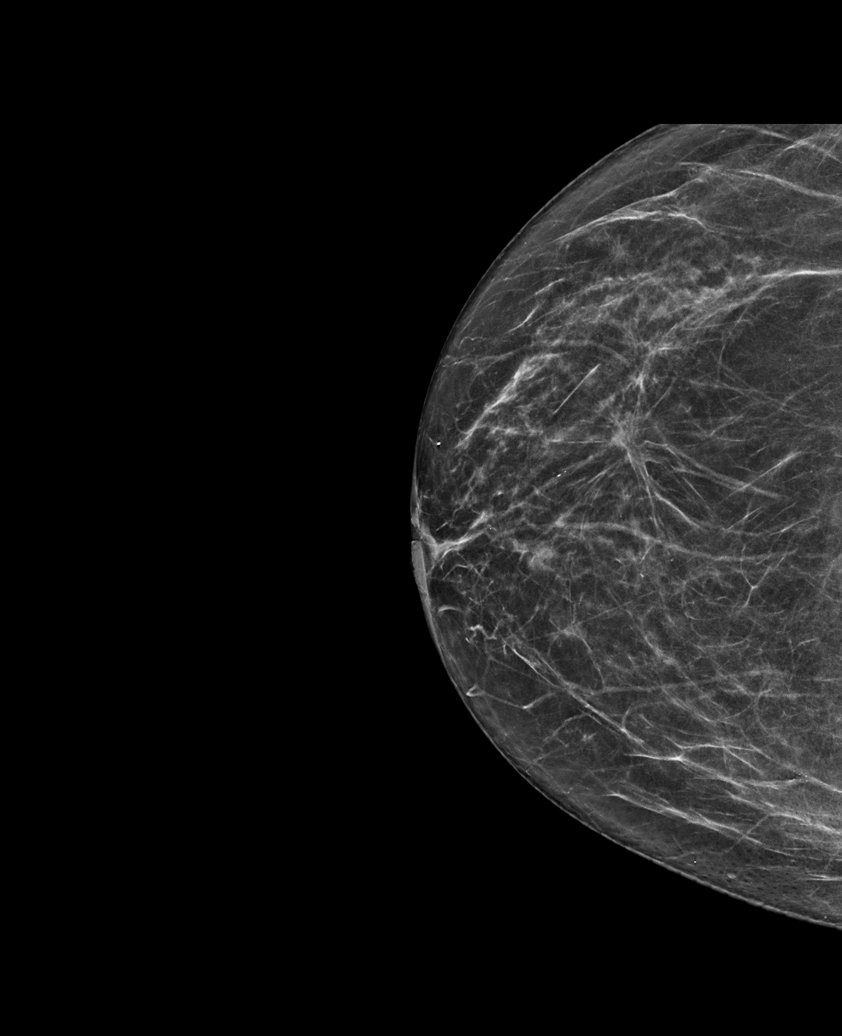

[L MLO tomo · tomo slice 37/72.0]
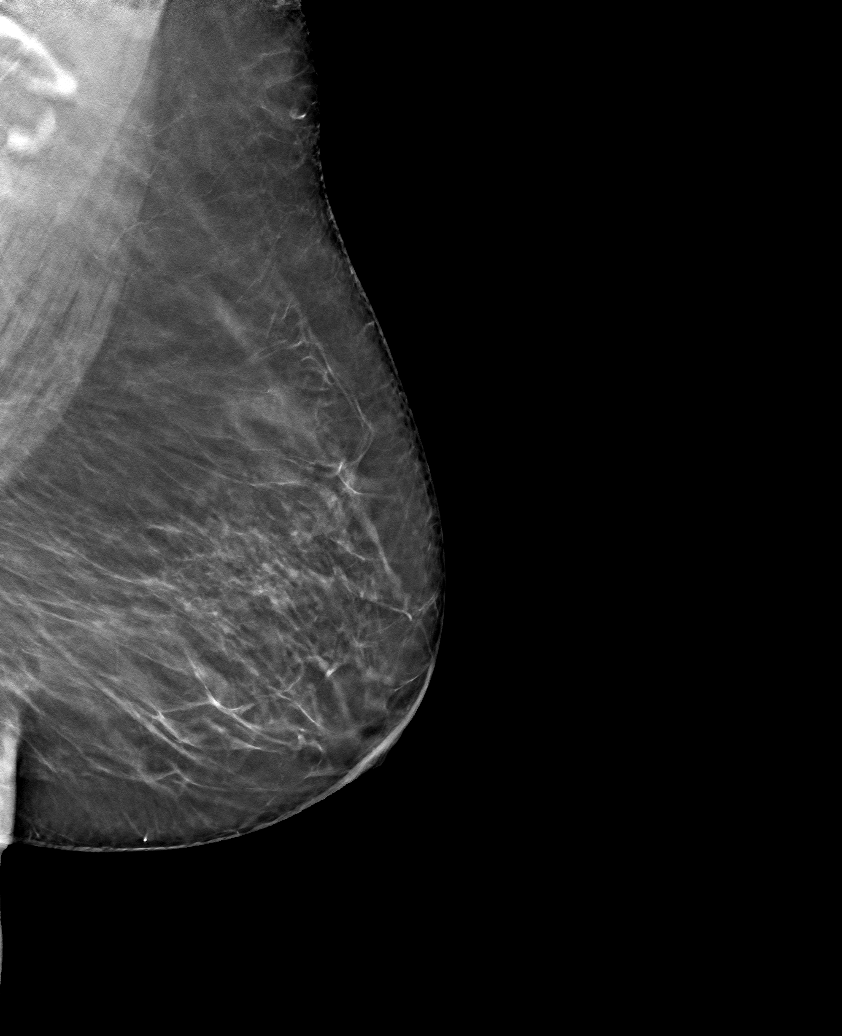

[6 of 30 positions shown; findings below may reference images not displayed]

ACR Breast Density Category b: There are scattered areas of
fibroglandular density.
FINDINGS: There are no findings suspicious for malignancy. Images were
processed with CAD.
IMPRESSION: No mammographic evidence of malignancy. A result letter of this
screening mammogram will be mailed directly to the patient.

RECOMMENDATION:
Screening mammogram in one year. (Code:[id number])

BI-RADS CATEGORY  1: Negative.

## 2020-01-03 ENCOUNTER — Ambulatory Visit
Admission: RE | Admit: 2020-01-03 | Discharge: 2020-01-03 | Disposition: A | Discharge status: Home / Self Care | Payer: Medicare HMO | Source: Ambulatory Visit | Attending: Internal Medicine | Admitting: Internal Medicine

## 2020-01-03 ENCOUNTER — Other Ambulatory Visit: Payer: Self-pay

## 2020-01-03 DIAGNOSIS — J4540 Moderate persistent asthma, uncomplicated: Secondary | ICD-10-CM | POA: Insufficient documentation

## 2020-01-03 DIAGNOSIS — R053 Chronic cough: Secondary | ICD-10-CM | POA: Insufficient documentation

## 2020-01-03 LAB — POCT I-STAT CREATININE: Creatinine, Ser: 0.7 mg/dL (ref 0.44–1.00)

## 2020-01-03 MED ORDER — IOHEXOL 300 MG/ML  SOLN
75.0000 mL | Freq: Once | INTRAMUSCULAR | Status: AC | PRN
  Administered 2020-01-03: 75 mL via INTRAVENOUS

## 2020-02-02 ENCOUNTER — Other Ambulatory Visit: Payer: Self-pay | Admitting: Internal Medicine

## 2020-02-02 DIAGNOSIS — Z1231 Encounter for screening mammogram for malignant neoplasm of breast: Secondary | ICD-10-CM

## 2020-03-07 ENCOUNTER — Other Ambulatory Visit: Payer: Self-pay

## 2020-03-07 ENCOUNTER — Ambulatory Visit
Admission: RE | Admit: 2020-03-07 | Discharge: 2020-03-07 | Disposition: A | Discharge status: Home / Self Care | Payer: Medicare HMO | Source: Ambulatory Visit | Attending: Internal Medicine | Admitting: Internal Medicine

## 2020-03-07 DIAGNOSIS — Z1231 Encounter for screening mammogram for malignant neoplasm of breast: Secondary | ICD-10-CM

## 2020-03-22 ENCOUNTER — Other Ambulatory Visit: Payer: Self-pay

## 2020-03-22 ENCOUNTER — Encounter: Payer: Self-pay | Admitting: Ophthalmology

## 2020-03-24 ENCOUNTER — Other Ambulatory Visit
Admission: RE | Admit: 2020-03-24 | Discharge: 2020-03-24 | Disposition: A | Discharge status: Home / Self Care | Payer: Medicare HMO | Source: Ambulatory Visit | Attending: Ophthalmology | Admitting: Ophthalmology

## 2020-03-24 DIAGNOSIS — Z20822 Contact with and (suspected) exposure to covid-19: Secondary | ICD-10-CM | POA: Insufficient documentation

## 2020-03-24 DIAGNOSIS — Z01812 Encounter for preprocedural laboratory examination: Secondary | ICD-10-CM | POA: Insufficient documentation

## 2020-03-24 NOTE — Discharge Instructions (Signed)

## 2020-03-25 LAB — SARS CORONAVIRUS 2 (TAT 6-24 HRS): SARS Coronavirus 2: NEGATIVE

## 2020-03-28 ENCOUNTER — Encounter: Payer: Self-pay | Admitting: Ophthalmology

## 2020-03-28 ENCOUNTER — Ambulatory Visit: Payer: Medicare HMO | Admitting: Anesthesiology

## 2020-03-28 ENCOUNTER — Other Ambulatory Visit: Payer: Self-pay

## 2020-03-28 ENCOUNTER — Ambulatory Visit
Admission: RE | Admit: 2020-03-28 | Discharge: 2020-03-28 | Disposition: A | Discharge status: Home / Self Care | Payer: Medicare HMO | Attending: Ophthalmology | Admitting: Ophthalmology

## 2020-03-28 ENCOUNTER — Encounter
Admission: RE | Disposition: A | Discharge status: Home / Self Care | Payer: Self-pay | Source: Home / Self Care | Attending: Ophthalmology

## 2020-03-28 DIAGNOSIS — Z7951 Long term (current) use of inhaled steroids: Secondary | ICD-10-CM | POA: Diagnosis not present

## 2020-03-28 DIAGNOSIS — Z7982 Long term (current) use of aspirin: Secondary | ICD-10-CM | POA: Insufficient documentation

## 2020-03-28 DIAGNOSIS — H2511 Age-related nuclear cataract, right eye: Secondary | ICD-10-CM | POA: Insufficient documentation

## 2020-03-28 DIAGNOSIS — Z79899 Other long term (current) drug therapy: Secondary | ICD-10-CM | POA: Diagnosis not present

## 2020-03-28 HISTORY — DX: Presence of dental prosthetic device (complete) (partial): Z97.2

## 2020-03-28 HISTORY — PX: CATARACT EXTRACTION W/PHACO: SHX586

## 2020-03-28 SURGERY — PHACOEMULSIFICATION, CATARACT, WITH IOL INSERTION
Anesthesia: Monitor Anesthesia Care | Site: Eye | Laterality: Right

## 2020-03-28 MED ORDER — FENTANYL CITRATE (PF) 100 MCG/2ML IJ SOLN
INTRAMUSCULAR | Status: DC | PRN
  Administered 2020-03-28 (×2): 50 ug via INTRAVENOUS

## 2020-03-28 MED ORDER — MIDAZOLAM HCL 2 MG/2ML IJ SOLN
INTRAMUSCULAR | Status: DC | PRN
  Administered 2020-03-28: 2 mg via INTRAVENOUS

## 2020-03-28 MED ORDER — MOXIFLOXACIN HCL 0.5 % OP SOLN
OPHTHALMIC | Status: DC | PRN
  Administered 2020-03-28: 0.2 mL via OPHTHALMIC

## 2020-03-28 MED ORDER — BRIMONIDINE TARTRATE-TIMOLOL 0.2-0.5 % OP SOLN
OPHTHALMIC | Status: DC | PRN
  Administered 2020-03-28: 1 [drp] via OPHTHALMIC

## 2020-03-28 MED ORDER — EPINEPHRINE PF 1 MG/ML IJ SOLN
INTRAOCULAR | Status: DC | PRN
  Administered 2020-03-28: 56 mL via OPHTHALMIC

## 2020-03-28 MED ORDER — ARMC OPHTHALMIC DILATING DROPS
1.0000 "application " | OPHTHALMIC | Status: DC | PRN
  Administered 2020-03-28 (×3): 1 via OPHTHALMIC

## 2020-03-28 MED ORDER — TETRACAINE HCL 0.5 % OP SOLN
1.0000 [drp] | OPHTHALMIC | Status: DC | PRN
  Administered 2020-03-28 (×3): 1 [drp] via OPHTHALMIC

## 2020-03-28 MED ORDER — LIDOCAINE HCL (PF) 2 % IJ SOLN
INTRAOCULAR | Status: DC | PRN
  Administered 2020-03-28: 1 mL

## 2020-03-28 MED ORDER — NA CHONDROIT SULF-NA HYALURON 40-17 MG/ML IO SOLN
INTRAOCULAR | Status: DC | PRN
  Administered 2020-03-28: 1 mL via INTRAOCULAR

## 2020-03-28 SURGICAL SUPPLY — 21 items
CANNULA ANT/CHMB 27G (MISCELLANEOUS) ×2 IMPLANT
CANNULA ANT/CHMB 27GA (MISCELLANEOUS) ×4 IMPLANT
GLOVE SURG LX 8.0 MICRO (GLOVE) ×2
GLOVE SURG LX STRL 8.0 MICRO (GLOVE) ×1 IMPLANT
GLOVE SURG TRIUMPH 8.0 PF LTX (GLOVE) ×2 IMPLANT
GOWN STRL REUS W/ TWL LRG LVL3 (GOWN DISPOSABLE) ×2 IMPLANT
GOWN STRL REUS W/TWL LRG LVL3 (GOWN DISPOSABLE) ×4
LENS IOL TECNIS EYHANCE 23.5 (Intraocular Lens) ×1 IMPLANT
MARKER SKIN DUAL TIP RULER LAB (MISCELLANEOUS) ×2 IMPLANT
NDL FILTER BLUNT 18X1 1/2 (NEEDLE) ×1 IMPLANT
NEEDLE FILTER BLUNT 18X 1/2SAF (NEEDLE) ×1
NEEDLE FILTER BLUNT 18X1 1/2 (NEEDLE) ×1 IMPLANT
PACK EYE AFTER SURG (MISCELLANEOUS) ×2 IMPLANT
PACK OPTHALMIC (MISCELLANEOUS) ×2 IMPLANT
PACK PORFILIO (MISCELLANEOUS) ×2 IMPLANT
SUT ETHILON 10-0 CS-B-6CS-B-6 (SUTURE)
SUTURE EHLN 10-0 CS-B-6CS-B-6 (SUTURE) IMPLANT
SYR 3ML LL SCALE MARK (SYRINGE) ×2 IMPLANT
SYR TB 1ML LUER SLIP (SYRINGE) ×2 IMPLANT
WATER STERILE IRR 250ML POUR (IV SOLUTION) ×2 IMPLANT
WIPE NON LINTING 3.25X3.25 (MISCELLANEOUS) ×2 IMPLANT

## 2020-03-28 NOTE — Addendum Note (Signed)
Addendum  created 03/28/20 0930 by Ardeth Sportsman, MD   Review and Sign - Ready for Procedure

## 2020-03-28 NOTE — Anesthesia Procedure Notes (Signed)
Procedure Name: MAC Date/Time: 03/28/2020 8:56 AM Performed by: Jeannene Patella, CRNA Pre-anesthesia Checklist: Patient identified, Emergency Drugs available, Suction available, Timeout performed and Patient being monitored Patient Re-evaluated:Patient Re-evaluated prior to induction Oxygen Delivery Method: Nasal cannula Placement Confirmation: positive ETCO2

## 2020-03-28 NOTE — Anesthesia Preprocedure Evaluation (Signed)
Anesthesia Evaluation  Patient identified by MRN, date of birth, ID band Patient awake    Reviewed: Allergy & Precautions, NPO status , Patient's Chart, lab work & pertinent test results  Airway Mallampati: II  TM Distance: >3 FB Neck ROM: Full    Dental  (+) Upper Dentures   Pulmonary asthma ,    Pulmonary exam normal        Cardiovascular hypertension, Normal cardiovascular exam     Neuro/Psych negative neurological ROS  negative psych ROS   GI/Hepatic Neg liver ROS, GERD  Controlled,  Endo/Other  BMI 32  Renal/GU negative Renal ROS     Musculoskeletal negative musculoskeletal ROS (+)   Abdominal (+) + obese,   Peds  Hematology negative hematology ROS (+)   Anesthesia Other Findings   Reproductive/Obstetrics                             Anesthesia Physical Anesthesia Plan  ASA: II  Anesthesia Plan: MAC   Post-op Pain Management:    Induction:   PONV Risk Score and Plan: 2 and TIVA, Midazolam and Treatment may vary due to age or medical condition  Airway Management Planned: Natural Airway and Nasal Cannula  Additional Equipment: None  Intra-op Plan:   Post-operative Plan:   Informed Consent: I have reviewed the patients History and Physical, chart, labs and discussed the procedure including the risks, benefits and alternatives for the proposed anesthesia with the patient or authorized representative who has indicated his/her understanding and acceptance.     Dental advisory given  Plan Discussed with: CRNA  Anesthesia Plan Comments:         Anesthesia Quick Evaluation

## 2020-03-28 NOTE — Op Note (Signed)
PREOPERATIVE DIAGNOSIS:  Nuclear sclerotic cataract of the right eye.   POSTOPERATIVE DIAGNOSIS:   Cataract   OPERATIVE PROCEDURE:@   SURGEON:  Birder Robson, MD.   ANESTHESIA:  Anesthesiologist: Ardeth Sportsman, MD CRNA: Jeannene Patella, CRNA  1.      Managed anesthesia care. 2.      0.6ml of Shugarcaine was instilled in the eye following the paracentesis.   COMPLICATIONS:  None.   TECHNIQUE:   Stop and chop   DESCRIPTION OF PROCEDURE:  The patient was examined and consented in the preoperative holding area where the aforementioned topical anesthesia was applied to the right eye and then brought back to the Operating Room where the right eye was prepped and draped in the usual sterile ophthalmic fashion and a lid speculum was placed. A paracentesis was created with the side port blade and the anterior chamber was filled with viscoelastic. A near clear corneal incision was performed with the steel keratome. A continuous curvilinear capsulorrhexis was performed with a cystotome followed by the capsulorrhexis forceps. Hydrodissection and hydrodelineation were carried out with BSS on a blunt cannula. The lens was removed in a stop and chop  technique and the remaining cortical material was removed with the irrigation-aspiration handpiece. The capsular bag was inflated with viscoelastic and the Technis [id number]  lens was placed in the capsular bag without complication. The remaining viscoelastic was removed from the eye with the irrigation-aspiration handpiece. The wounds were hydrated. The anterior chamber was flushed with BSS and the eye was inflated to physiologic pressure. 0.39ml of Vigamox was placed in the anterior chamber. The wounds were found to be water tight. The eye was dressed with Combigan. The patient was given protective glasses to wear throughout the day and a shield with which to sleep tonight. The patient was also given drops with which to begin a drop regimen today and will  follow-up with me in one day. Implant Name Type Inv. Item Serial No. Manufacturer Lot No. LRB No. Used Action  LENS IOL TECNIS EYHANCE 23.5 - [id number] Intraocular Lens LENS IOL TECNIS EYHANCE 23.5 [id number] JOHNSON   Right 1 Implanted   Procedure(s) with comments: CATARACT EXTRACTION PHACO AND INTRAOCULAR LENS PLACEMENT (IOC) RIGHT (Right) - 5.21 0:36.3  Electronically signed: Birder Robson 03/28/2020 9:08 AM

## 2020-03-28 NOTE — Transfer of Care (Signed)
Immediate Anesthesia Transfer of Care Note  Patient: Breanna Gilbert  Procedure(s) Performed: CATARACT EXTRACTION PHACO AND INTRAOCULAR LENS PLACEMENT (IOC) RIGHT (Right Eye)  Patient Location: PACU  Anesthesia Type: MAC  Level of Consciousness: awake, alert  and patient cooperative  Airway and Oxygen Therapy: Patient Spontanous Breathing and Patient connected to supplemental oxygen  Post-op Assessment: Post-op Vital signs reviewed, Patient's Cardiovascular Status Stable, Respiratory Function Stable, Patent Airway and No signs of Nausea or vomiting  Post-op Vital Signs: Reviewed and stable  Complications: No complications documented.

## 2020-03-28 NOTE — Anesthesia Postprocedure Evaluation (Signed)
Anesthesia Post Note  Patient: Breanna Gilbert  Procedure(s) Performed: CATARACT EXTRACTION PHACO AND INTRAOCULAR LENS PLACEMENT (IOC) RIGHT (Right Eye)     Patient location during evaluation: PACU Anesthesia Type: MAC Pain management: pain level controlled Vital Signs Assessment: post-procedure vital signs reviewed and stable Respiratory status: spontaneous breathing and nonlabored ventilation Cardiovascular status: blood pressure returned to baseline Postop Assessment: no apparent nausea or vomiting Anesthetic complications: no   No complications documented.  [facility] [employer]

## 2020-03-28 NOTE — H&P (Signed)
[place]   Primary Care Physician:  Tracie Harrier, MD Ophthalmologist: Dr. George Ina  Pre-Procedure History & Physical: HPI:  Breanna Gilbert is a 75 y.o. female here for cataract surgery.   Past Medical History:  Diagnosis Date  . Adenomatous polyps   . Asthma   . GERD (gastroesophageal reflux disease)   . Hypertension   . Wears dentures    full upper    Past Surgical History:  Procedure Laterality Date  . ABDOMINAL HYSTERECTOMY    . BREAST EXCISIONAL BIOPSY Right 2004   NEG  . COLONOSCOPY    . COLONOSCOPY WITH PROPOFOL N/A 01/14/2018   Procedure: COLONOSCOPY WITH PROPOFOL;  Surgeon: Manya Silvas, MD;  Location: [facility] ENDOSCOPY;  Service: Endoscopy;  Laterality: N/A;    Prior to Admission medications   Medication Sig Start Date End Date Taking? Authorizing Provider  acetaminophen (TYLENOL) 500 MG tablet Take 1,000 mg by mouth every 6 (six) hours as needed.   Yes [provider]  albuterol (PROVENTIL HFA;VENTOLIN HFA) 108 (90 Base) MCG/ACT inhaler Inhale into the lungs every 6 (six) hours as needed for wheezing or shortness of breath.   Yes [provider]  aspirin 325 MG tablet Take 325 mg by mouth daily.   Yes [provider]  b complex vitamins capsule Take 1 capsule by mouth daily.   Yes [provider]  cetirizine (ZYRTEC) 10 MG tablet Take 10 mg by mouth daily.   Yes [provider]  Cholecalciferol (VITAMIN D3) 1.25 MG (50000 UT) TABS Take by mouth daily.   Yes [provider]  dupilumab (DUPIXENT) 300 MG/2ML prefilled syringe Inject 300 mg into the skin every 14 (fourteen) days.   Yes [provider]  fluticasone (FLONASE) 50 MCG/ACT nasal spray Place 2 sprays into both nostrils daily.   Yes [provider]  Fluticasone-Salmeterol (ADVAIR) 250-50 MCG/DOSE AEPB Inhale 1 puff into the lungs 2 (two) times daily.   Yes [provider]  losartan (COZAAR) 50 MG tablet Take 50 mg by  mouth daily.   Yes [provider]  melatonin 1 MG TABS tablet Take 3 mg by mouth at bedtime as needed.   Yes [provider]  montelukast (SINGULAIR) 10 MG tablet Take 10 mg by mouth at bedtime.   Yes [provider]  simvastatin (ZOCOR) 40 MG tablet Take 20 mg by mouth daily.   Yes [provider]  vitamin E 100 UNIT capsule Take 400 Units by mouth daily.   Yes [provider]  phentermine (ADIPEX-P) 37.5 MG tablet Take 37.5 mg by mouth daily before breakfast. PRN Patient not taking: Reported on 03/28/2020    [provider]    Allergies as of 02/22/2020  . (Not on File)    Family History  Problem Relation Age of Onset  . Breast cancer Maternal Aunt     Social History   Socioeconomic History  . Marital status: Divorced    Spouse name: Not on file  . Number of children: Not on file  . Years of education: Not on file  . Highest education level: Not on file  Occupational History  . Not on file  Tobacco Use  . Smoking status: Never Smoker  . Smokeless tobacco: Never Used  Vaping Use  . Vaping Use: Never used  Substance and Sexual Activity  . Alcohol use: Never  . Drug use: Never  . Sexual activity: Not on file  Other Topics Concern  . Not on  file  Social History Narrative  . Not on file   Social Determinants of Health   Financial Resource Strain: Not on file  Food Insecurity: Not on file  Transportation Needs: Not on file  Physical Activity: Not on file  Stress: Not on file  Social Connections: Not on file  Intimate Partner Violence: Not on file    Review of Systems: See HPI, otherwise negative ROS  Physical Exam: BP (!) 163/66   Pulse 84   Temp 98 F (36.7 C) (Temporal)   Ht 5\' 2"  (1.575 m)   Wt 79.8 kg   SpO2 100%   BMI 32.19 kg/m  General:   Alert,  pleasant and cooperative in NAD Head:  Normocephalic and atraumatic. Respiratory:  Normal work of breathing.  Impression/Plan: Breanna Gilbert is here  for cataract surgery.  Risks, benefits, limitations, and alternatives regarding cataract surgery have been reviewed with the patient.  Questions have been answered.  All parties agreeable.   Birder Robson, MD  03/28/2020, 8:45 AM

## 2020-03-29 ENCOUNTER — Encounter: Payer: Self-pay | Admitting: Ophthalmology

## 2020-04-10 ENCOUNTER — Other Ambulatory Visit: Payer: Self-pay

## 2020-04-10 ENCOUNTER — Encounter: Payer: Self-pay | Admitting: Ophthalmology

## 2020-04-14 ENCOUNTER — Other Ambulatory Visit: Payer: Self-pay

## 2020-04-14 ENCOUNTER — Other Ambulatory Visit
Admission: RE | Admit: 2020-04-14 | Discharge: 2020-04-14 | Disposition: A | Discharge status: Home / Self Care | Payer: Medicare HMO | Source: Ambulatory Visit | Attending: Ophthalmology | Admitting: Ophthalmology

## 2020-04-14 DIAGNOSIS — Z01812 Encounter for preprocedural laboratory examination: Secondary | ICD-10-CM | POA: Insufficient documentation

## 2020-04-14 DIAGNOSIS — Z20822 Contact with and (suspected) exposure to covid-19: Secondary | ICD-10-CM | POA: Diagnosis not present

## 2020-04-14 NOTE — Discharge Instructions (Signed)

## 2020-04-15 LAB — SARS CORONAVIRUS 2 (TAT 6-24 HRS): SARS Coronavirus 2: NEGATIVE

## 2020-04-17 NOTE — Anesthesia Preprocedure Evaluation (Addendum)
Anesthesia Evaluation  Patient identified by MRN, date of birth, ID band Patient awake    Reviewed: Allergy & Precautions, NPO status , Patient's Chart, lab work & pertinent test results  History of Anesthesia Complications Negative for: history of anesthetic complications  Airway Mallampati: III   Neck ROM: Full    Dental  (+) Upper Dentures   Pulmonary asthma ,    Pulmonary exam normal breath sounds clear to auscultation       Cardiovascular hypertension, Normal cardiovascular exam Rhythm:Regular Rate:Normal     Neuro/Psych negative neurological ROS     GI/Hepatic negative GI ROS,   Endo/Other  Obesity   Renal/GU negative Renal ROS     Musculoskeletal   Abdominal   Peds  Hematology  (+) Blood dyscrasia, anemia ,   Anesthesia Other Findings   Reproductive/Obstetrics                            Anesthesia Physical Anesthesia Plan  ASA: II  Anesthesia Plan: MAC   Post-op Pain Management:    Induction: Intravenous  PONV Risk Score and Plan: 2 and TIVA, Midazolam and Treatment may vary due to age or medical condition  Airway Management Planned: Nasal Cannula  Additional Equipment:   Intra-op Plan:   Post-operative Plan:   Informed Consent: I have reviewed the patients History and Physical, chart, labs and discussed the procedure including the risks, benefits and alternatives for the proposed anesthesia with the patient or authorized representative who has indicated his/her understanding and acceptance.       Plan Discussed with: CRNA  Anesthesia Plan Comments:        Anesthesia Quick Evaluation

## 2020-04-18 ENCOUNTER — Encounter: Payer: Self-pay | Admitting: Ophthalmology

## 2020-04-18 ENCOUNTER — Ambulatory Visit
Admission: RE | Admit: 2020-04-18 | Discharge: 2020-04-18 | Disposition: A | Discharge status: Home / Self Care | Payer: Medicare HMO | Attending: Ophthalmology | Admitting: Ophthalmology

## 2020-04-18 ENCOUNTER — Encounter
Admission: RE | Disposition: A | Discharge status: Home / Self Care | Payer: Self-pay | Source: Home / Self Care | Attending: Ophthalmology

## 2020-04-18 ENCOUNTER — Ambulatory Visit: Payer: Medicare HMO | Admitting: Anesthesiology

## 2020-04-18 ENCOUNTER — Other Ambulatory Visit: Payer: Self-pay

## 2020-04-18 DIAGNOSIS — Z79899 Other long term (current) drug therapy: Secondary | ICD-10-CM | POA: Insufficient documentation

## 2020-04-18 DIAGNOSIS — H2512 Age-related nuclear cataract, left eye: Secondary | ICD-10-CM | POA: Insufficient documentation

## 2020-04-18 DIAGNOSIS — Z7951 Long term (current) use of inhaled steroids: Secondary | ICD-10-CM | POA: Insufficient documentation

## 2020-04-18 DIAGNOSIS — Z803 Family history of malignant neoplasm of breast: Secondary | ICD-10-CM | POA: Insufficient documentation

## 2020-04-18 DIAGNOSIS — Z7982 Long term (current) use of aspirin: Secondary | ICD-10-CM | POA: Insufficient documentation

## 2020-04-18 HISTORY — PX: CATARACT EXTRACTION W/PHACO: SHX586

## 2020-04-18 SURGERY — PHACOEMULSIFICATION, CATARACT, WITH IOL INSERTION
Anesthesia: Monitor Anesthesia Care | Site: Eye | Laterality: Left

## 2020-04-18 MED ORDER — TETRACAINE HCL 0.5 % OP SOLN
1.0000 [drp] | OPHTHALMIC | Status: DC | PRN
  Administered 2020-04-18 (×3): 1 [drp] via OPHTHALMIC

## 2020-04-18 MED ORDER — ARMC OPHTHALMIC DILATING DROPS
1.0000 "application " | OPHTHALMIC | Status: DC | PRN
  Administered 2020-04-18 (×3): 1 via OPHTHALMIC

## 2020-04-18 MED ORDER — LACTATED RINGERS IV SOLN: INTRAVENOUS | Status: DC

## 2020-04-18 MED ORDER — BRIMONIDINE TARTRATE-TIMOLOL 0.2-0.5 % OP SOLN
OPHTHALMIC | Status: DC | PRN
  Administered 2020-04-18: 1 [drp] via OPHTHALMIC

## 2020-04-18 MED ORDER — EPINEPHRINE PF 1 MG/ML IJ SOLN
INTRAOCULAR | Status: DC | PRN
  Administered 2020-04-18: 42 mL via OPHTHALMIC

## 2020-04-18 MED ORDER — NA CHONDROIT SULF-NA HYALURON 40-17 MG/ML IO SOLN
INTRAOCULAR | Status: DC | PRN
  Administered 2020-04-18: 1 mL via INTRAOCULAR

## 2020-04-18 MED ORDER — ACETAMINOPHEN 160 MG/5ML PO SOLN: 325.0000 mg | ORAL | Status: DC | PRN

## 2020-04-18 MED ORDER — ONDANSETRON HCL 4 MG/2ML IJ SOLN: 4.0000 mg | Freq: Once | INTRAMUSCULAR | Status: DC | PRN

## 2020-04-18 MED ORDER — LIDOCAINE HCL (PF) 2 % IJ SOLN
INTRAOCULAR | Status: DC | PRN
  Administered 2020-04-18: 1 mL

## 2020-04-18 MED ORDER — MOXIFLOXACIN HCL 0.5 % OP SOLN
OPHTHALMIC | Status: DC | PRN
  Administered 2020-04-18: 0.2 mL via OPHTHALMIC

## 2020-04-18 MED ORDER — MIDAZOLAM HCL 2 MG/2ML IJ SOLN
INTRAMUSCULAR | Status: DC | PRN
  Administered 2020-04-18: 2 mg via INTRAVENOUS

## 2020-04-18 MED ORDER — FENTANYL CITRATE (PF) 100 MCG/2ML IJ SOLN
INTRAMUSCULAR | Status: DC | PRN
  Administered 2020-04-18: 100 ug via INTRAVENOUS

## 2020-04-18 MED ORDER — ACETAMINOPHEN 325 MG PO TABS: 650.0000 mg | ORAL_TABLET | Freq: Once | ORAL | Status: DC | PRN

## 2020-04-18 SURGICAL SUPPLY — 19 items
CANNULA ANT/CHMB 27G (MISCELLANEOUS) ×2 IMPLANT
CANNULA ANT/CHMB 27GA (MISCELLANEOUS) ×4 IMPLANT
GLOVE SURG LX 8.0 MICRO (GLOVE) ×1
GLOVE SURG LX STRL 8.0 MICRO (GLOVE) ×1 IMPLANT
GLOVE SURG TRIUMPH 8.0 PF LTX (GLOVE) ×2 IMPLANT
GOWN STRL REUS W/ TWL LRG LVL3 (GOWN DISPOSABLE) ×2 IMPLANT
GOWN STRL REUS W/TWL LRG LVL3 (GOWN DISPOSABLE) ×4
LENS IOL TECNIS EYHANCE 23.5 (Intraocular Lens) ×1 IMPLANT
MARKER SKIN DUAL TIP RULER LAB (MISCELLANEOUS) ×2 IMPLANT
NDL FILTER BLUNT 18X1 1/2 (NEEDLE) ×1 IMPLANT
NEEDLE FILTER BLUNT 18X 1/2SAF (NEEDLE) ×1
NEEDLE FILTER BLUNT 18X1 1/2 (NEEDLE) ×1 IMPLANT
PACK EYE AFTER SURG (MISCELLANEOUS) ×2 IMPLANT
PACK OPTHALMIC (MISCELLANEOUS) ×2 IMPLANT
PACK PORFILIO (MISCELLANEOUS) ×2 IMPLANT
SYR 3ML LL SCALE MARK (SYRINGE) ×2 IMPLANT
SYR TB 1ML LUER SLIP (SYRINGE) ×2 IMPLANT
WATER STERILE IRR 250ML POUR (IV SOLUTION) ×2 IMPLANT
WIPE NON LINTING 3.25X3.25 (MISCELLANEOUS) ×2 IMPLANT

## 2020-04-18 NOTE — Op Note (Signed)
PREOPERATIVE DIAGNOSIS:  Nuclear sclerotic cataract of the left eye.   POSTOPERATIVE DIAGNOSIS:  Nuclear sclerotic cataract of the left eye.   OPERATIVE PROCEDURE:@   SURGEON:  Birder Robson, MD.   ANESTHESIA:  Anesthesiologist: Darrin Nipper, MD CRNA: Silvana Newness, CRNA; Cameron Ali, CRNA  1.      Managed anesthesia care. 2.     0.30ml of Shugarcaine was instilled following the paracentesis   COMPLICATIONS:  None.   TECHNIQUE:   Stop and chop   DESCRIPTION OF PROCEDURE:  The patient was examined and consented in the preoperative holding area where the aforementioned topical anesthesia was applied to the left eye and then brought back to the Operating Room where the left eye was prepped and draped in the usual sterile ophthalmic fashion and a lid speculum was placed. A paracentesis was created with the side port blade and the anterior chamber was filled with viscoelastic. A near clear corneal incision was performed with the steel keratome. A continuous curvilinear capsulorrhexis was performed with a cystotome followed by the capsulorrhexis forceps. Hydrodissection and hydrodelineation were carried out with BSS on a blunt cannula. The lens was removed in a stop and chop  technique and the remaining cortical material was removed with the irrigation-aspiration handpiece. The capsular bag was inflated with viscoelastic and the Technis [id number] lens was placed in the capsular bag without complication. The remaining viscoelastic was removed from the eye with the irrigation-aspiration handpiece. The wounds were hydrated. The anterior chamber was flushed with BSS and the eye was inflated to physiologic pressure. 0.52ml Vigamox was placed in the anterior chamber. The wounds were found to be water tight. The eye was dressed with Combigan. The patient was given protective glasses to wear throughout the day and a shield with which to sleep tonight. The patient was also given drops with which to begin a  drop regimen today and will follow-up with me in one day. Implant Name Type Inv. Item Serial No. Manufacturer Lot No. LRB No. Used Action  LENS IOL TECNIS EYHANCE 23.5 - [id number] Intraocular Lens LENS IOL TECNIS EYHANCE 23.5 [id number] JOHNSON   Left 1 Implanted    Procedure(s): CATARACT EXTRACTION PHACO AND INTRAOCULAR LENS PLACEMENT (IOC) LEFT 4.34 00:33.7 (Left)  Electronically signed: Birder Robson 04/18/2020 9:47 AM

## 2020-04-18 NOTE — Transfer of Care (Signed)
Immediate Anesthesia Transfer of Care Note  Patient: Breanna Gilbert  Procedure(s) Performed: CATARACT EXTRACTION PHACO AND INTRAOCULAR LENS PLACEMENT (IOC) LEFT 4.34 00:33.7 (Left Eye)  Patient Location: PACU  Anesthesia Type: MAC  Level of Consciousness: awake, alert  and patient cooperative  Airway and Oxygen Therapy: Patient Spontanous Breathing and Patient connected to supplemental oxygen  Post-op Assessment: Post-op Vital signs reviewed, Patient's Cardiovascular Status Stable, Respiratory Function Stable, Patent Airway and No signs of Nausea or vomiting  Post-op Vital Signs: Reviewed and stable  Complications: No complications documented.

## 2020-04-18 NOTE — H&P (Signed)
[place]   Primary Care Physician:  Tracie Harrier, MD Ophthalmologist: Dr. Benay Pillow  Pre-Procedure History & Physical: HPI:  Breanna Gilbert is a 75 y.o. female here for cataract surgery.   Past Medical History:  Diagnosis Date  . Adenomatous polyps   . Asthma   . GERD (gastroesophageal reflux disease)   . Hypertension   . Wears dentures    full upper    Past Surgical History:  Procedure Laterality Date  . ABDOMINAL HYSTERECTOMY    . BREAST EXCISIONAL BIOPSY Right 2004   NEG  . CATARACT EXTRACTION W/PHACO Right 03/28/2020   Procedure: CATARACT EXTRACTION PHACO AND INTRAOCULAR LENS PLACEMENT ([place]) RIGHT;  Surgeon: Birder Robson, MD;  Location: [place];  Service: Ophthalmology;  Laterality: Right;  5.21 0:36.3  . COLONOSCOPY    . COLONOSCOPY WITH PROPOFOL N/A 01/14/2018   Procedure: COLONOSCOPY WITH PROPOFOL;  Surgeon: Manya Silvas, MD;  Location: [facility] ENDOSCOPY;  Service: Endoscopy;  Laterality: N/A;    Prior to Admission medications   Medication Sig Start Date End Date Taking? Authorizing Provider  acetaminophen (TYLENOL) 500 MG tablet Take 1,000 mg by mouth every 6 (six) hours as needed.   Yes [provider]  albuterol (PROVENTIL HFA;VENTOLIN HFA) 108 (90 Base) MCG/ACT inhaler Inhale into the lungs every 6 (six) hours as needed for wheezing or shortness of breath.   Yes [provider]  aspirin 325 MG tablet Take 325 mg by mouth daily.   Yes [provider]  b complex vitamins capsule Take 1 capsule by mouth daily.   Yes [provider]  cetirizine (ZYRTEC) 10 MG tablet Take 10 mg by mouth daily.   Yes [provider]  Cholecalciferol (VITAMIN D3) 1.25 MG (50000 UT) TABS Take by mouth daily.   Yes [provider]  dupilumab (DUPIXENT) 300 MG/2ML prefilled syringe Inject 300 mg into the skin every 14 (fourteen) days.   Yes [provider]  fluticasone (FLONASE) 50 MCG/ACT nasal  spray Place 2 sprays into both nostrils daily.   Yes [provider]  Fluticasone-Salmeterol (ADVAIR) 250-50 MCG/DOSE AEPB Inhale 1 puff into the lungs 2 (two) times daily.   Yes [provider]  losartan (COZAAR) 50 MG tablet Take 50 mg by mouth daily.   Yes [provider]  melatonin 1 MG TABS tablet Take 3 mg by mouth at bedtime as needed.   Yes [provider]  montelukast (SINGULAIR) 10 MG tablet Take 10 mg by mouth at bedtime.   Yes [provider]  simvastatin (ZOCOR) 40 MG tablet Take 20 mg by mouth daily.   Yes [provider]  vitamin E 100 UNIT capsule Take 400 Units by mouth daily.   Yes [provider]  phentermine (ADIPEX-P) 37.5 MG tablet Take 37.5 mg by mouth daily before breakfast. PRN Patient not taking: Reported on 03/28/2020    [provider]    Allergies as of 02/22/2020  . (Not on File)    Family History  Problem Relation Age of Onset  . Breast cancer Maternal Aunt     Social History   Socioeconomic History  . Marital status: Divorced    Spouse name: Not on file  . Number of children: Not on file  . Years of education: Not on file  . Highest education level: Not on file  Occupational History  . Not on file  Tobacco Use  . Smoking status: Never Smoker  . Smokeless tobacco: Never Used  Vaping Use  . Vaping Use: Never used  Substance and Sexual Activity  . Alcohol use: Never  . Drug use: Never  . Sexual activity: Not on file  Other Topics Concern  . Not on file  Social History Narrative  . Not on file   Social Determinants of Health   Financial Resource Strain: Not on file  Food Insecurity: Not on file  Transportation Needs: Not on file  Physical Activity: Not on file  Stress: Not on file  Social Connections: Not on file  Intimate Partner Violence: Not on file    Review of Systems: See HPI, otherwise negative ROS  Physical Exam: BP (!) 167/61   Pulse 80   Temp (!) 97  F (36.1 C) (Temporal)   Resp 16   Ht 5\' 2"  (1.575 m)   Wt 85.3 kg   SpO2 100%   BMI 34.39 kg/m  General:   Alert,  pleasant and cooperative in NAD Head:  Normocephalic and atraumatic. Respiratory:  Normal work of breathing.  Impression/Plan: Breanna Gilbert is here for cataract surgery.  Risks, benefits, limitations, and alternatives regarding cataract surgery have been reviewed with the patient.  Questions have been answered.  All parties agreeable.   Birder Robson, MD  04/18/2020, 9:20 AM

## 2020-04-18 NOTE — Anesthesia Postprocedure Evaluation (Signed)
Anesthesia Post Note  Patient: Breanna Gilbert  Procedure(s) Performed: CATARACT EXTRACTION PHACO AND INTRAOCULAR LENS PLACEMENT (IOC) LEFT 4.34 00:33.7 (Left Eye)     Patient location during evaluation: PACU Anesthesia Type: MAC Level of consciousness: awake and alert, oriented and patient cooperative Pain management: pain level controlled Vital Signs Assessment: post-procedure vital signs reviewed and stable Respiratory status: spontaneous breathing, nonlabored ventilation and respiratory function stable Cardiovascular status: blood pressure returned to baseline and stable Postop Assessment: adequate PO intake Anesthetic complications: no   No complications documented.  Darrin Nipper

## 2020-04-18 NOTE — Anesthesia Procedure Notes (Signed)
Procedure Name: MAC Date/Time: 04/18/2020 9:32 AM Performed by: Silvana Newness, CRNA Pre-anesthesia Checklist: Patient identified, Emergency Drugs available, Suction available, Patient being monitored and Timeout performed Patient Re-evaluated:Patient Re-evaluated prior to induction Oxygen Delivery Method: Nasal cannula Placement Confirmation: positive ETCO2

## 2020-04-19 ENCOUNTER — Encounter: Payer: Self-pay | Admitting: Ophthalmology

## 2021-02-02 ENCOUNTER — Other Ambulatory Visit: Payer: Self-pay | Admitting: Internal Medicine

## 2021-02-02 DIAGNOSIS — Z1231 Encounter for screening mammogram for malignant neoplasm of breast: Secondary | ICD-10-CM

## 2021-03-08 ENCOUNTER — Other Ambulatory Visit: Payer: Self-pay

## 2021-03-08 ENCOUNTER — Ambulatory Visit
Admission: RE | Admit: 2021-03-08 | Discharge: 2021-03-08 | Disposition: A | Discharge status: Home / Self Care | Payer: Medicare HMO | Source: Ambulatory Visit | Attending: Internal Medicine | Admitting: Internal Medicine

## 2021-03-08 DIAGNOSIS — Z1231 Encounter for screening mammogram for malignant neoplasm of breast: Secondary | ICD-10-CM | POA: Insufficient documentation

## 2022-02-01 ENCOUNTER — Other Ambulatory Visit: Payer: Self-pay | Admitting: Obstetrics and Gynecology

## 2022-02-01 DIAGNOSIS — Z1231 Encounter for screening mammogram for malignant neoplasm of breast: Secondary | ICD-10-CM

## 2022-06-20 ENCOUNTER — Ambulatory Visit
Admission: RE | Admit: 2022-06-20 | Discharge: 2022-06-20 | Disposition: A | Discharge status: Home / Self Care | Payer: Medicare HMO | Source: Ambulatory Visit | Attending: Obstetrics and Gynecology | Admitting: Obstetrics and Gynecology

## 2022-06-20 DIAGNOSIS — Z1231 Encounter for screening mammogram for malignant neoplasm of breast: Secondary | ICD-10-CM | POA: Insufficient documentation

## 2023-05-15 ENCOUNTER — Other Ambulatory Visit: Payer: Self-pay | Admitting: Internal Medicine

## 2023-05-15 DIAGNOSIS — Z1231 Encounter for screening mammogram for malignant neoplasm of breast: Secondary | ICD-10-CM

## 2023-06-20 ENCOUNTER — Encounter
Admission: RE | Disposition: A | Discharge status: Home / Self Care | Payer: Self-pay | Source: Home / Self Care | Attending: Gastroenterology

## 2023-06-20 ENCOUNTER — Ambulatory Visit: Admitting: Anesthesiology

## 2023-06-20 ENCOUNTER — Encounter: Payer: Self-pay | Admitting: Gastroenterology

## 2023-06-20 ENCOUNTER — Other Ambulatory Visit: Payer: Self-pay

## 2023-06-20 ENCOUNTER — Ambulatory Visit
Admission: RE | Admit: 2023-06-20 | Discharge: 2023-06-20 | Disposition: A | Discharge status: Home / Self Care | Payer: Medicare HMO | Attending: Gastroenterology | Admitting: Gastroenterology

## 2023-06-20 DIAGNOSIS — Z1211 Encounter for screening for malignant neoplasm of colon: Secondary | ICD-10-CM | POA: Diagnosis present

## 2023-06-20 DIAGNOSIS — Z90710 Acquired absence of both cervix and uterus: Secondary | ICD-10-CM | POA: Diagnosis not present

## 2023-06-20 DIAGNOSIS — I10 Essential (primary) hypertension: Secondary | ICD-10-CM | POA: Diagnosis not present

## 2023-06-20 DIAGNOSIS — J45909 Unspecified asthma, uncomplicated: Secondary | ICD-10-CM | POA: Diagnosis not present

## 2023-06-20 DIAGNOSIS — K5730 Diverticulosis of large intestine without perforation or abscess without bleeding: Secondary | ICD-10-CM | POA: Diagnosis not present

## 2023-06-20 DIAGNOSIS — Z83719 Family history of colon polyps, unspecified: Secondary | ICD-10-CM | POA: Insufficient documentation

## 2023-06-20 DIAGNOSIS — D123 Benign neoplasm of transverse colon: Secondary | ICD-10-CM | POA: Diagnosis not present

## 2023-06-20 DIAGNOSIS — D120 Benign neoplasm of cecum: Secondary | ICD-10-CM | POA: Diagnosis not present

## 2023-06-20 HISTORY — PX: COLONOSCOPY WITH PROPOFOL: SHX5780

## 2023-06-20 LAB — GLUCOSE, CAPILLARY: Glucose-Capillary: 107 mg/dL — ABNORMAL HIGH (ref 70–99)

## 2023-06-20 SURGERY — COLONOSCOPY WITH PROPOFOL
Anesthesia: General

## 2023-06-20 MED ORDER — LIDOCAINE HCL (CARDIAC) PF 100 MG/5ML IV SOSY
PREFILLED_SYRINGE | INTRAVENOUS | Status: DC | PRN
  Administered 2023-06-20: 60 mg via INTRAVENOUS

## 2023-06-20 MED ORDER — DEXMEDETOMIDINE HCL IN NACL 80 MCG/20ML IV SOLN
INTRAVENOUS | Status: DC | PRN
  Administered 2023-06-20: 20 ug via INTRAVENOUS

## 2023-06-20 MED ORDER — LIDOCAINE HCL (PF) 2 % IJ SOLN
INTRAMUSCULAR | Status: AC
  Filled 2023-06-20: qty 5

## 2023-06-20 MED ORDER — SODIUM CHLORIDE 0.9 % IV SOLN: INTRAVENOUS | Status: DC

## 2023-06-20 MED ORDER — PROPOFOL 10 MG/ML IV BOLUS
INTRAVENOUS | Status: DC | PRN
  Administered 2023-06-20: 50 mg via INTRAVENOUS
  Administered 2023-06-20: 20 mg via INTRAVENOUS

## 2023-06-20 MED ORDER — PROPOFOL 1000 MG/100ML IV EMUL
INTRAVENOUS | Status: AC
  Filled 2023-06-20: qty 100

## 2023-06-20 MED ORDER — PROPOFOL 500 MG/50ML IV EMUL
INTRAVENOUS | Status: DC | PRN
  Administered 2023-06-20: 75 ug/kg/min via INTRAVENOUS

## 2023-06-20 NOTE — Anesthesia Preprocedure Evaluation (Signed)
 Anesthesia Evaluation  Patient identified by MRN, date of birth, ID band Patient awake    Reviewed: Allergy & Precautions, NPO status , Patient's Chart, lab work & pertinent test results  History of Anesthesia Complications Negative for: history of anesthetic complications  Airway Mallampati: III  TM Distance: <3 FB Neck ROM: full    Dental  (+) Chipped, Upper Dentures   Pulmonary neg shortness of breath, asthma    Pulmonary exam normal        Cardiovascular Exercise Tolerance: Good hypertension, (-) angina Normal cardiovascular exam     Neuro/Psych negative neurological ROS  negative psych ROS   GI/Hepatic Neg liver ROS,GERD  Controlled,,  Endo/Other  negative endocrine ROS    Renal/GU negative Renal ROS  negative genitourinary   Musculoskeletal   Abdominal   Peds  Hematology negative hematology ROS (+)   Anesthesia Other Findings Past Medical History: No date: Adenomatous polyps No date: Asthma No date: GERD (gastroesophageal reflux disease) No date: Hypertension No date: Wears dentures     Comment:  full upper  Past Surgical History: No date: ABDOMINAL HYSTERECTOMY 2004: BREAST EXCISIONAL BIOPSY; Right     Comment:  NEG 03/28/2020: CATARACT EXTRACTION W/PHACO; Right     Comment:  Procedure: CATARACT EXTRACTION PHACO AND INTRAOCULAR               LENS PLACEMENT (IOC) RIGHT;  Surgeon: Galen Manila,               MD;  Location: [facility];  Service:               Ophthalmology;  Laterality: Right;  5.21 0:36.3 04/18/2020: CATARACT EXTRACTION W/PHACO; Left     Comment:  Procedure: CATARACT EXTRACTION PHACO AND INTRAOCULAR               LENS PLACEMENT (IOC) LEFT 4.34 00:33.7;  Surgeon:               Galen Manila, MD;  Location: [facility];                Service: Ophthalmology;  Laterality: Left; No date: COLONOSCOPY 01/14/2018: COLONOSCOPY WITH PROPOFOL; N/A     Comment:   Procedure: COLONOSCOPY WITH PROPOFOL;  Surgeon: Scot Jun, MD;  Location: [facility] ENDOSCOPY;  Service:               Endoscopy;  Laterality: N/A;  BMI    Body Mass Index: 34.13 kg/m      Reproductive/Obstetrics negative OB ROS                             Anesthesia Physical Anesthesia Plan  ASA: 2  Anesthesia Plan: General   Post-op Pain Management:    Induction: Intravenous  PONV Risk Score and Plan: Propofol infusion and TIVA  Airway Management Planned: Natural Airway and Nasal Cannula  Additional Equipment:   Intra-op Plan:   Post-operative Plan:   Informed Consent: I have reviewed the patients History and Physical, chart, labs and discussed the procedure including the risks, benefits and alternatives for the proposed anesthesia with the patient or authorized representative who has indicated his/her understanding and acceptance.     Dental Advisory Given  Plan Discussed with: Anesthesiologist, CRNA and Surgeon  Anesthesia Plan Comments: (Patient consented for risks of anesthesia including but not limited to:  - adverse reactions  to medications - risk of airway placement if required - damage to eyes, teeth, lips or other oral mucosa - nerve damage due to positioning  - sore throat or hoarseness - Damage to heart, brain, nerves, lungs, other parts of body or loss of life  Patient voiced understanding and assent.)       Anesthesia Quick Evaluation

## 2023-06-20 NOTE — H&P (Signed)
 Pre-Procedure H&P   Patient ID: Breanna Gilbert is a 78 y.o. female.  Gastroenterology Provider: Jaynie Collins, DO  Referring Provider: Fransico Setters, NP PCP: Barbette Reichmann, MD  Date: 06/20/2023  HPI Ms. Breanna Gilbert is a 78 y.o. female who presents today for Colonoscopy for Personal history of colon polyps .  Patient reports daily bowel movement without melena or hematochezia.  Last underwent colonoscopy in October 2019 with left-sided diverticulosis and internal hemorrhoids.  2014 with similar findings, but 1 tubular adenoma  Mother with a history of colon polyps  Patient is on 325 mg of aspirin which has been held for the procedure (last taken 4 days ago)  Status post hysterectomy   Past Medical History:  Diagnosis Date   Adenomatous polyps    Asthma    GERD (gastroesophageal reflux disease)    Hypertension    Wears dentures    full upper    Past Surgical History:  Procedure Laterality Date   ABDOMINAL HYSTERECTOMY     BREAST EXCISIONAL BIOPSY Right 2004   NEG   CATARACT EXTRACTION W/PHACO Right 03/28/2020   Procedure: CATARACT EXTRACTION PHACO AND INTRAOCULAR LENS PLACEMENT (IOC) RIGHT;  Surgeon: Galen Manila, MD;  Location: [facility];  Service: Ophthalmology;  Laterality: Right;  5.21 0:36.3   CATARACT EXTRACTION W/PHACO Left 04/18/2020   Procedure: CATARACT EXTRACTION PHACO AND INTRAOCULAR LENS PLACEMENT (IOC) LEFT 4.34 00:33.7;  Surgeon: Galen Manila, MD;  Location: [facility];  Service: Ophthalmology;  Laterality: Left;   COLONOSCOPY     COLONOSCOPY WITH PROPOFOL N/A 01/14/2018   Procedure: COLONOSCOPY WITH PROPOFOL;  Surgeon: Scot Jun, MD;  Location: [facility] ENDOSCOPY;  Service: Endoscopy;  Laterality: N/A;    Family History Mother- colon polyps No other h/o GI disease or malignancy  Review of Systems  Constitutional:  Negative for activity change, appetite change, chills, diaphoresis, fatigue, fever and unexpected  weight change.  HENT:  Negative for trouble swallowing and voice change.   Respiratory:  Negative for shortness of breath and wheezing.   Cardiovascular:  Negative for chest pain, palpitations and leg swelling.  Gastrointestinal:  Negative for abdominal distention, abdominal pain, anal bleeding, blood in stool, constipation, diarrhea, nausea, rectal pain and vomiting.  Musculoskeletal:  Negative for arthralgias and myalgias.  Skin:  Negative for color change and pallor.  Neurological:  Negative for dizziness, syncope and weakness.  Psychiatric/Behavioral:  Negative for confusion.   All other systems reviewed and are negative.    Medications No current facility-administered medications on file prior to encounter.   Current Outpatient Medications on File Prior to Encounter  Medication Sig Dispense Refill   acetaminophen (TYLENOL) 500 MG tablet Take 1,000 mg by mouth every 6 (six) hours as needed.     albuterol (PROVENTIL HFA;VENTOLIN HFA) 108 (90 Base) MCG/ACT inhaler Inhale into the lungs every 6 (six) hours as needed for wheezing or shortness of breath.     aspirin 325 MG tablet Take 325 mg by mouth daily.     b complex vitamins capsule Take 1 capsule by mouth daily.     cetirizine (ZYRTEC) 10 MG tablet Take 10 mg by mouth daily.     Cholecalciferol (VITAMIN D3) 1.25 MG (50000 UT) TABS Take by mouth daily.     Fluticasone-Salmeterol (ADVAIR) 250-50 MCG/DOSE AEPB Inhale 1 puff into the lungs 2 (two) times daily.     Fluticasone-Umeclidin-Vilant (TRELEGY ELLIPTA) 100-62.5-25 MCG/ACT AEPB Inhale 100 mcg into the lungs daily.  melatonin 1 MG TABS tablet Take 3 mg by mouth at bedtime as needed.     metFORMIN (GLUCOPHAGE) 500 MG tablet Take 500 mg by mouth daily.     metoprolol tartrate (LOPRESSOR) 25 MG tablet Take 25 mg by mouth daily.     montelukast (SINGULAIR) 10 MG tablet Take 10 mg by mouth at bedtime.     Multiple Vitamins-Minerals (PRESERVISION AREDS 2 PO) Take by mouth.      predniSONE (DELTASONE) 20 MG tablet Take 20 mg by mouth daily with breakfast.     vitamin E 100 UNIT capsule Take 400 Units by mouth daily.     dupilumab (DUPIXENT) 300 MG/2ML prefilled syringe Inject 300 mg into the skin every 14 (fourteen) days. (Patient not taking: Reported on 06/20/2023)     fluticasone (FLONASE) 50 MCG/ACT nasal spray Place 2 sprays into both nostrils daily.     losartan (COZAAR) 50 MG tablet Take 50 mg by mouth daily. (Patient not taking: Reported on 06/20/2023)     phentermine (ADIPEX-P) 37.5 MG tablet Take 37.5 mg by mouth daily before breakfast. PRN (Patient not taking: Reported on 03/28/2020)     simvastatin (ZOCOR) 40 MG tablet Take 20 mg by mouth daily. (Patient not taking: Reported on 06/20/2023)      Pertinent medications related to GI and procedure were reviewed by me with the patient prior to the procedure   Current Facility-Administered Medications:    0.9 %  sodium chloride infusion, , Intravenous, Continuous, Jaynie Collins, DO, Last Rate: 20 mL/hr at 06/20/23 0839, New Bag at 06/20/23 0839  sodium chloride 20 mL/hr at 06/20/23 2130       No Known Allergies Allergies were reviewed by me prior to the procedure  Objective   Body mass index is 34.13 kg/m. Vitals:   06/20/23 0838  BP: (!) 154/57  Pulse: 85  Resp: 12  Temp: (!) 97 F (36.1 C)  TempSrc: Temporal  SpO2: 98%  Weight: 84.6 kg  Height: 5\' 2"  (1.575 m)     Physical Exam Vitals and nursing note reviewed.  Constitutional:      General: She is not in acute distress.    Appearance: Normal appearance. She is not ill-appearing, toxic-appearing or diaphoretic.  HENT:     Head: Normocephalic and atraumatic.     Nose: Nose normal.     Mouth/Throat:     Mouth: Mucous membranes are moist.     Pharynx: Oropharynx is clear.  Eyes:     General: No scleral icterus.    Extraocular Movements: Extraocular movements intact.  Cardiovascular:     Rate and Rhythm: Normal rate and regular  rhythm.     Heart sounds: Normal heart sounds. No murmur heard.    No friction rub. No gallop.  Pulmonary:     Effort: Pulmonary effort is normal. No respiratory distress.     Breath sounds: Normal breath sounds. No wheezing, rhonchi or rales.  Abdominal:     General: Bowel sounds are normal. There is no distension.     Palpations: Abdomen is soft.     Tenderness: There is no abdominal tenderness. There is no guarding or rebound.  Musculoskeletal:     Cervical back: Neck supple.     Right lower leg: No edema.     Left lower leg: No edema.  Skin:    General: Skin is warm and dry.     Coloration: Skin is not jaundiced or pale.  Neurological:     General:  No focal deficit present.     Mental Status: She is alert and oriented to person, place, and time. Mental status is at baseline.  Psychiatric:        Mood and Affect: Mood normal.        Behavior: Behavior normal.        Thought Content: Thought content normal.        Judgment: Judgment normal.      Assessment:  Ms. Breanna Gilbert is a 78 y.o. female  who presents today for Colonoscopy for Personal history of colon polyps .  Plan:  Colonoscopy with possible intervention today  Colonoscopy with possible biopsy, control of bleeding, polypectomy, and interventions as necessary has been discussed with the patient/patient representative. Informed consent was obtained from the patient/patient representative after explaining the indication, nature, and risks of the procedure including but not limited to death, bleeding, perforation, missed neoplasm/lesions, cardiorespiratory compromise, and reaction to medications. Opportunity for questions was given and appropriate answers were provided. Patient/patient representative has verbalized understanding is amenable to undergoing the procedure.   Jaynie Collins, DO  [facility] Gastroenterology  Portions of the record may have been created with voice recognition software. Occasional  wrong-word or 'sound-a-like' substitutions may have occurred due to the inherent limitations of voice recognition software.  Read the chart carefully and recognize, using context, where substitutions may have occurred.

## 2023-06-20 NOTE — Op Note (Signed)
 [facility] Gastroenterology Patient Name: Breanna Gilbert Procedure Date: 06/20/2023 9:18 AM MRN: [id number] Account #: [id number] Date of Birth: 08-17-1945 Admit Type: Outpatient Age: 78 Room: [facility] [place] ENDO ROOM 1 Gender: Female Note Status: Finalized Instrument Name: Colonscope [id number] Procedure:             Colonoscopy Indications:           High risk colon cancer surveillance: Personal history                         of colonic polyps Providers:             Trenda Moots, DO Referring MD:          Jaynie Collins DO, DO (Referring MD), Barbette Reichmann, MD (Referring MD) Medicines:             Monitored Anesthesia Care Complications:         No immediate complications. Estimated blood loss:                         Minimal. Procedure:             Pre-Anesthesia Assessment:                        - Prior to the procedure, a History and Physical was                         performed, and patient medications and allergies were                         reviewed. The patient is competent. The risks and                         benefits of the procedure and the sedation options and                         risks were discussed with the patient. All questions                         were answered and informed consent was obtained.                         Patient identification and proposed procedure were                         verified by the physician, the nurse, the anesthetist                         and the technician in the endoscopy suite. Mental                         Status Examination: alert and oriented. Airway                         Examination: normal oropharyngeal airway and neck  mobility. Respiratory Examination: clear to                         auscultation. CV Examination: RRR, no murmurs, no S3                         or S4. Prophylactic Antibiotics: The patient does not                          require prophylactic antibiotics. Prior                         Anticoagulants: The patient has taken no anticoagulant                         or antiplatelet agents except for aspirin. ASA Grade                         Assessment: II - A patient with mild systemic disease.                         After reviewing the risks and benefits, the patient                         was deemed in satisfactory condition to undergo the                         procedure. The anesthesia plan was to use monitored                         anesthesia care (MAC). Immediately prior to                         administration of medications, the patient was                         re-assessed for adequacy to receive sedatives. The                         heart rate, respiratory rate, oxygen saturations,                         blood pressure, adequacy of pulmonary ventilation, and                         response to care were monitored throughout the                         procedure. The physical status of the patient was                         re-assessed after the procedure.                        After obtaining informed consent, the colonoscope was                         passed under direct vision. Throughout the procedure,  the patient's blood pressure, pulse, and oxygen                         saturations were monitored continuously. The                         Colonoscope was introduced through the anus and                         advanced to the the cecum, identified by appendiceal                         orifice and ileocecal valve. The colonoscopy was                         performed without difficulty. The patient tolerated                         the procedure well. The quality of the bowel                         preparation was evaluated using the BBPS [facility] Bowel                         Preparation Scale) with scores of: Right Colon = 3,                          Transverse Colon = 3 and Left Colon = 3 (entire mucosa                         seen well with no residual staining, small fragments                         of stool or opaque liquid). The total BBPS score                         equals 9. The ileocecal valve, appendiceal orifice,                         and rectum were photographed. Findings:      The perianal and digital rectal examinations were normal. Pertinent       negatives include normal sphincter tone.      Three sessile polyps were found in the transverse colon and cecum. The       polyps were 2 to 4 mm in size. These polyps were removed with a cold       snare. Resection and retrieval were complete. Estimated blood loss was       minimal.      Multiple small-mouthed diverticula were found in the left colon.       Estimated blood loss: none.      The exam was otherwise without abnormality on direct and retroflexion       views. Impression:            - Three 2 to 4 mm polyps in the transverse colon and                         in the  cecum, removed with a cold snare. Resected and                         retrieved.                        - Diverticulosis in the left colon.                        - The examination was otherwise normal on direct and                         retroflexion views. Recommendation:        - Patient has a contact number available for                         emergencies. The signs and symptoms of potential                         delayed complications were discussed with the patient.                         Return to normal activities tomorrow. Written                         discharge instructions were provided to the patient.                        - Discharge patient to home.                        - Resume previous diet.                        - Continue present medications.                        - Await pathology results.                        - No further repeat colonoscopy given age and only 4                          polyps lifetime.                        - Return to referring physician as previously                         scheduled.                        - The findings and recommendations were discussed with                         the patient. Procedure Code(s):     --- Professional ---                        [phone], Colonoscopy, flexible; with removal of  tumor(s), polyp(s), or other lesion(s) by snare                         technique Diagnosis Code(s):     --- Professional ---                        Z86.010, Personal history of colonic polyps                        D12.3, Benign neoplasm of transverse colon (hepatic                         flexure or splenic flexure)                        D12.0, Benign neoplasm of cecum                        K57.30, Diverticulosis of large intestine without                         perforation or abscess without bleeding CPT copyright 2022 [employer]. All rights reserved. The codes documented in this report are preliminary and upon coder review may  be revised to meet current compliance requirements. Attending Participation:      I personally performed the entire procedure. Elfredia Nevins, DO Jaynie Collins DO, DO 06/20/2023 9:50:15 AM This report has been signed electronically. Number of Addenda: 0 Note Initiated On: 06/20/2023 9:18 AM Scope Withdrawal Time: 0 hours 12 minutes 6 seconds  Total Procedure Duration: 0 hours 18 minutes 54 seconds  Estimated Blood Loss:  Estimated blood loss was minimal.      [facility]

## 2023-06-20 NOTE — Interval H&P Note (Signed)
 History and Physical Interval Note: Preprocedure H&P from 06/20/23  was reviewed and there was no interval change after seeing and examining the patient.  Written consent was obtained from the patient after discussion of risks, benefits, and alternatives. Patient has consented to proceed with Colonoscopy with possible intervention   06/20/2023 9:14 AM  [name]  has presented today for surgery, with the diagnosis of Z86.0101 (ICD-10-CM) - Hx of adenomatous colonic polyps.  The various methods of treatment have been discussed with the patient and family. After consideration of risks, benefits and other options for treatment, the patient has consented to  Procedure(s) with comments: COLONOSCOPY WITH PROPOFOL (N/A) - DM as a surgical intervention.  The patient's history has been reviewed, patient examined, no change in status, stable for surgery.  I have reviewed the patient's chart and labs.  Questions were answered to the patient's satisfaction.     Breanna Gilbert

## 2023-06-20 NOTE — Transfer of Care (Signed)
 Immediate Anesthesia Transfer of Care Note  Patient: Breanna Gilbert  Procedure(s) Performed: COLONOSCOPY WITH PROPOFOL  Patient Location: PACU  Anesthesia Type:General  Level of Consciousness: sedated  Airway & Oxygen Therapy: Patient Spontanous Breathing  Post-op Assessment: Report given to RN and Post -op Vital signs reviewed and stable  Post vital signs: Reviewed and stable  Last Vitals:  Vitals Value Taken Time  BP    Temp 36.2 C 06/20/23 0945  Pulse    Resp    SpO2      Last Pain:  Vitals:   06/20/23 0945  TempSrc:   PainSc: (P) 0-No pain         Complications: No notable events documented.

## 2023-06-20 NOTE — Anesthesia Postprocedure Evaluation (Signed)
 Anesthesia Post Note  Patient: Breanna Gilbert  Procedure(s) Performed: COLONOSCOPY WITH PROPOFOL  Patient location during evaluation: Endoscopy Anesthesia Type: General Level of consciousness: awake and alert Pain management: pain level controlled Vital Signs Assessment: post-procedure vital signs reviewed and stable Respiratory status: spontaneous breathing, nonlabored ventilation, respiratory function stable and patient connected to nasal cannula oxygen Cardiovascular status: blood pressure returned to baseline and stable Postop Assessment: no apparent nausea or vomiting Anesthetic complications: no   No notable events documented.   Last Vitals:  Vitals:   06/20/23 0955 06/20/23 1005  BP: (!) 102/55 (!) 114/51  Pulse: 68 68  Resp: (!) 22 (!) 21  Temp: (!) 36.2 C   SpO2: 98% 96%    Last Pain:  Vitals:   06/20/23 1005  TempSrc:   PainSc: 0-No pain                 Cleda Mccreedy Rayleen Wyrick

## 2023-06-23 ENCOUNTER — Encounter: Payer: Self-pay | Admitting: Gastroenterology

## 2023-06-23 LAB — SURGICAL PATHOLOGY

## 2023-06-24 ENCOUNTER — Ambulatory Visit
Admission: RE | Admit: 2023-06-24 | Discharge: 2023-06-24 | Disposition: A | Discharge status: Home / Self Care | Payer: Medicare HMO | Source: Ambulatory Visit | Attending: Internal Medicine | Admitting: Internal Medicine

## 2023-06-24 DIAGNOSIS — Z1231 Encounter for screening mammogram for malignant neoplasm of breast: Secondary | ICD-10-CM | POA: Diagnosis present
